# Patient Record
Sex: Male | Born: 1955 | Race: White | Hispanic: No | Marital: Married | Smoking: Never smoker
Health system: Southern US, Community
[De-identification: ages and names within clinical notes are randomized; demographics above are authoritative.]

## PROBLEM LIST (undated history)

## (undated) DIAGNOSIS — I10 Essential (primary) hypertension: Secondary | ICD-10-CM

## (undated) DIAGNOSIS — N289 Disorder of kidney and ureter, unspecified: Secondary | ICD-10-CM

## (undated) DIAGNOSIS — G250 Essential tremor: Secondary | ICD-10-CM

## (undated) HISTORY — PX: INGUINAL HERNIA REPAIR: SUR1180

## (undated) HISTORY — DX: Essential tremor: G25.0

## (undated) HISTORY — DX: Essential (primary) hypertension: I10

## (undated) HISTORY — PX: NEPHRECTOMY TRANSPLANTED ORGAN: SUR880

---

## 2012-08-06 DIAGNOSIS — N042 Nephrotic syndrome with diffuse membranous glomerulonephritis: Secondary | ICD-10-CM | POA: Insufficient documentation

## 2013-02-02 DIAGNOSIS — Z940 Kidney transplant status: Secondary | ICD-10-CM | POA: Insufficient documentation

## 2013-02-02 DIAGNOSIS — I10 Essential (primary) hypertension: Secondary | ICD-10-CM | POA: Insufficient documentation

## 2013-02-02 DIAGNOSIS — D849 Immunodeficiency, unspecified: Secondary | ICD-10-CM | POA: Insufficient documentation

## 2013-04-21 ENCOUNTER — Emergency Department (INDEPENDENT_AMBULATORY_CARE_PROVIDER_SITE_OTHER): Payer: BC Managed Care – PPO

## 2013-04-21 ENCOUNTER — Emergency Department
Admission: EM | Admit: 2013-04-21 | Discharge: 2013-04-21 | Disposition: A | Discharge status: Home / Self Care | Payer: BC Managed Care – PPO | Source: Home / Self Care | Attending: Emergency Medicine | Admitting: Emergency Medicine

## 2013-04-21 ENCOUNTER — Encounter: Payer: Self-pay | Admitting: Emergency Medicine

## 2013-04-21 DIAGNOSIS — R05 Cough: Secondary | ICD-10-CM

## 2013-04-21 DIAGNOSIS — R059 Cough, unspecified: Secondary | ICD-10-CM

## 2013-04-21 DIAGNOSIS — R062 Wheezing: Secondary | ICD-10-CM

## 2013-04-21 DIAGNOSIS — R0989 Other specified symptoms and signs involving the circulatory and respiratory systems: Secondary | ICD-10-CM

## 2013-04-21 DIAGNOSIS — J209 Acute bronchitis, unspecified: Secondary | ICD-10-CM

## 2013-04-21 HISTORY — DX: Disorder of kidney and ureter, unspecified: N28.9

## 2013-04-21 MED ORDER — IPRATROPIUM-ALBUTEROL 0.5-2.5 (3) MG/3ML IN SOLN
3.0000 mL | Freq: Once | RESPIRATORY_TRACT | Status: AC
  Administered 2013-04-21: 3 mL via RESPIRATORY_TRACT

## 2013-04-21 MED ORDER — PREDNISONE (PAK) 10 MG PO TABS: ORAL_TABLET | ORAL | Status: DC

## 2013-04-21 MED ORDER — ALBUTEROL SULFATE HFA 108 (90 BASE) MCG/ACT IN AERS: 2.0000 | INHALATION_SPRAY | RESPIRATORY_TRACT | Status: DC | PRN

## 2013-04-21 MED ORDER — AZITHROMYCIN 250 MG PO TABS: ORAL_TABLET | ORAL | Status: DC

## 2013-04-21 MED ORDER — CEFTRIAXONE SODIUM 1 G IJ SOLR
1.0000 g | INTRAMUSCULAR | Status: AC
  Administered 2013-04-21: 1 g via INTRAMUSCULAR

## 2013-04-21 NOTE — ED Notes (Signed)
Sinus pain, pressure, headache, productive cough, green sputum, congestion, runny nose x 1 week

## 2013-04-21 NOTE — ED Provider Notes (Signed)
CSN: [id number]     Arrival date & time 04/21/13  1038 History   First MD Initiated Contact with Patient 04/21/13 1040     Chief Complaint  Patient presents with  . Sinus Problem    HPI URI HISTORY  Jamez is a 58 y.o. male who complains of onset of cold symptoms for 7 days.  Has not been using any OTC treatment. Progressively worsening cough objective of green sputum, sinus congestion with discolored rhinorrhea. Low-grade fever.  He'll be traveling on business trip to [place] soon. No recent international travel.  Of note, he is a kidney transplant recipient 2002, has been doing great ever since, but is on chronic immunosuppression medication status post kidney transplant. He is doing well on this. He denies history of recurrent infections. No history of asthma or chronic lung disease.  No chills/sweats +  Low-grade Fever  +  Nasal congestion +  Discolored Post-nasal drainage Mild sinus pain/pressure No sore throat  +  Cough productive of green sputum + wheezing + chest congestion No hemoptysis No shortness of breath. He admits to some fatigue with exertion. No pleuritic pain No chest pain at rest or with exertion  No itchy/red eyes No earache  No nausea No vomiting No abdominal pain No diarrhea  No skin rashes +  Fatigue No myalgias No headache   Past Medical History  Diagnosis Date  . Renal disorder    Past Surgical History  Procedure Laterality Date  . Nephrectomy transplanted organ     No family history on file. History  Substance Use Topics  . Smoking status: Never Smoker   . Smokeless tobacco: Not on file  . Alcohol Use: Yes    Review of Systems  All other systems reviewed and are negative.   Allergies  Review of patient's allergies indicates no known allergies.  Home Medications   Current Outpatient Rx  Name  Route  Sig  Dispense  Refill  . losartan (COZAAR) 100 MG tablet   Oral   Take 100 mg by mouth daily.         . mycophenolate  (CELLCEPT) 500 MG tablet   Oral   Take by mouth 2 (two) times daily.         . tacrolimus (PROGRAF) 1 MG capsule   Oral   Take 1 mg by mouth 2 (two) times daily.         Marland Kitchen albuterol (PROVENTIL HFA;VENTOLIN HFA) 108 (90 BASE) MCG/ACT inhaler   Inhalation   Inhale 2 puffs into the lungs every 4 (four) hours as needed for wheezing or shortness of breath.   18 g   0   . azithromycin (ZITHROMAX Z-PAK) 250 MG tablet      Take 2 tablets on day one, then 1 tablet daily on days 2 through 5   1 each   0   . predniSONE (STERAPRED UNI-PAK) 10 MG tablet      Take with food for 6 days.-Take 6 on day 1, 5 on day 2, 4 on day 3, then 3 tablets on day 4, then 2 tablets on day 5, then 1 on day 6.   21 tablet   0    BP 149/94  Pulse 79  Temp(Src) 97.6 F (36.4 C) (Oral)  Ht 5\' 11"  (1.803 m)  Wt 203 lb (92.08 kg)  BMI 28.33 kg/m2  SpO2 98% Physical Exam  Nursing note and vitals reviewed. Constitutional: He is oriented to person, place, and time. He appears  well-developed and well-nourished. No distress.  Alert, pleasant male. Fatigued, but no acute distress. Ambulatory  HENT:  Head: Normocephalic and atraumatic.  Right Ear: Tympanic membrane, external ear and ear canal normal.  Left Ear: Tympanic membrane, external ear and ear canal normal.  Nose: Mucosal edema and rhinorrhea present. Right sinus exhibits maxillary sinus tenderness. Left sinus exhibits maxillary sinus tenderness.  Mouth/Throat: Oropharynx is clear and moist. No oral lesions. No oropharyngeal exudate.  Eyes: Right eye exhibits no discharge. Left eye exhibits no discharge. No scleral icterus.  Neck: Neck supple.  Cardiovascular: Normal rate, regular rhythm, S1 normal, S2 normal and normal heart sounds.  Exam reveals no gallop and no decreased pulses.   No murmur heard. Pulmonary/Chest: Effort normal. He has wheezes (diffusely). He has rhonchi (Diffuse harsh rhonchi). He has rales (mild bibasilar crackles).  Abdominal:  He exhibits no distension.  Lymphadenopathy:    He has no cervical adenopathy.  Neurological: He is alert and oriented to person, place, and time.  Skin: Skin is warm and dry. No rash noted.  Psychiatric: He has a normal mood and affect.    ED Course  Procedures (including critical care time) Labs Review Labs Reviewed - No data to display Imaging Review Dg Chest 2 View  04/21/2013   CLINICAL DATA:  Cough and congestion.  Wheezing.  EXAM: CHEST  2 VIEW  COMPARISON:  None.  FINDINGS: There is some peribronchial thickening. No consolidative process, pneumothorax or effusion. Heart size is normal.  IMPRESSION: Bronchitic change without focal process.   Electronically Signed   By: Inge Rise M.D.   On: 04/21/2013 11:34     MDM   1. Acute bronchitis with bronchospasm    Treatment options discussed, as well as risks, benefits, alternatives. Patient voiced understanding and agreement with the following plans: DuoNeb nebulizer treatment given. Wheezing improved. Auscultation of lungs afterward, and he still had moderate diffuse rhonchi and faint bibasilar crackles and wheezing significantly improved. Improved air excursion. He felt that the DuoNeb helped. Chest x-ray: Some peribronchial thickening, consistent with bronchitis but no definite infiltrates. Because of the severity of bronchitis, will start treatment with Rocephin 1 g IM stat. Prescriptions: Zithromax Z-PAK for bacterial and atypical coverage. Albuterol HFA, instructions given, questions invited and answered. Use every 4 hours when necessary wheezing  Prednisone 10 mg-6 day dosepak Other symptomatic care and precautions discussed. Follow-up with your primary care doctor in 5-7 days if not improving, or sooner if symptoms become worse. Red flags discussed.--Go to ER stat if any severe worsening symptoms Questions invited and answered. Patient voiced understanding and agreement.     Jacqulyn Cane, MD 04/21/13 1226

## 2014-02-21 ENCOUNTER — Emergency Department (INDEPENDENT_AMBULATORY_CARE_PROVIDER_SITE_OTHER)
Admission: EM | Admit: 2014-02-21 | Discharge: 2014-02-21 | Disposition: A | Discharge status: Home / Self Care | Payer: BLUE CROSS/BLUE SHIELD | Source: Home / Self Care | Attending: Family Medicine | Admitting: Family Medicine

## 2014-02-21 ENCOUNTER — Encounter: Payer: Self-pay | Admitting: Emergency Medicine

## 2014-02-21 DIAGNOSIS — H6591 Unspecified nonsuppurative otitis media, right ear: Secondary | ICD-10-CM

## 2014-02-21 MED ORDER — AMOXICILLIN 875 MG PO TABS: 875.0000 mg | ORAL_TABLET | Freq: Two times a day (BID) | ORAL | Status: DC

## 2014-02-21 MED ORDER — FLUTICASONE PROPIONATE 50 MCG/ACT NA SUSP: NASAL | Status: DC

## 2014-02-21 NOTE — ED Notes (Signed)
Sinus pain, congestion, runny nose,, rt ear pain, pressure behind eyes, headache, sore throat in the morning.

## 2014-02-21 NOTE — ED Provider Notes (Signed)
CSN: [id number]     Arrival date & time 02/21/14  1236 History   First MD Initiated Contact with Patient 02/21/14 1333     Chief Complaint  Patient presents with  . Sinus Problem      HPI Comments: Patient complains of twelve day history of typical cold-like symptoms including mild sore throat, sinus congestion, headache, fatigue, and cough.  She has now developed right ear pain, yellow sinus discharge, and facial discomfort.  No fevers, chills, and sweats   The history is provided by the patient.    Past Medical History  Diagnosis Date  . Renal disorder    Past Surgical History  Procedure Laterality Date  . Nephrectomy transplanted organ     History reviewed. No pertinent family history. History  Substance Use Topics  . Smoking status: Never Smoker   . Smokeless tobacco: Not on file  . Alcohol Use: Yes    Review of Systems + sore throat + cough No pleuritic pain No wheezing + nasal congestion + post-nasal drainage + sinus pain/pressure No itchy/red eyes + earache No hemoptysis No SOB No fever/chills No nausea No vomiting No abdominal pain No diarrhea No urinary symptoms No skin rash No fatigue No myalgias No headache Used OTC meds without relief  Allergies  Review of patient's allergies indicates no known allergies.  Home Medications   Prior to Admission medications   Medication Sig Start Date End Date Taking? Authorizing Provider  albuterol (PROVENTIL HFA;VENTOLIN HFA) 108 (90 BASE) MCG/ACT inhaler Inhale 2 puffs into the lungs every 4 (four) hours as needed for wheezing or shortness of breath. 04/21/13   Jacqulyn Cane, MD  amoxicillin (AMOXIL) 875 MG tablet Take 1 tablet (875 mg total) by mouth 2 (two) times daily. 02/21/14   Kandra Nicolas, MD  fluticasone Asencion Islam) 50 MCG/ACT nasal spray Place two sprays in each nostril once daily 02/21/14   Kandra Nicolas, MD  losartan (COZAAR) 100 MG tablet Take 100 mg by mouth daily.    Historical Provider, MD   mycophenolate (CELLCEPT) 500 MG tablet Take by mouth 2 (two) times daily.    Historical Provider, MD  predniSONE (STERAPRED UNI-PAK) 10 MG tablet Take with food for 6 days.-Take 6 on day 1, 5 on day 2, 4 on day 3, then 3 tablets on day 4, then 2 tablets on day 5, then 1 on day 6. 04/21/13   Jacqulyn Cane, MD  tacrolimus (PROGRAF) 1 MG capsule Take 1 mg by mouth 2 (two) times daily.    Historical Provider, MD   BP 137/80 mmHg  Pulse 72  Temp(Src) 98.4 F (36.9 C) (Oral)  Ht 5\' 11"  (1.803 m)  Wt 205 lb (92.987 kg)  BMI 28.60 kg/m2  SpO2 98% Physical Exam Nursing notes and Vital Signs reviewed. Appearance:  Patient appears stated age, and in no acute distress Eyes:  Pupils are equal, round, and reactive to light and accomodation.  Extraocular movement is intact.  Conjunctivae are not inflamed  Ears:  Canals normal.  Left tympanic membrane is normal. Right tympanic membrane has poor landmarks with serous effusion. Nose:  Congested turbinates.   Mild maxillary sinus tenderness is present.  Pharynx:  Normal Neck:  Supple. No adenopathy Lungs:  Clear to auscultation.  Breath sounds are equal.  Heart:  Regular rate and rhythm without murmurs, rubs, or gallops.  Abdomen:  Nontender without masses or hepatosplenomegaly.  Bowel sounds are present.  No CVA or flank tenderness.  Extremities:  No edema.  No calf tenderness Skin:  No rash present.   ED Course  Procedures none   MDM   1. Right otitis media with effusion    Begin amoxicillin (may wait two or three days) Take plain guaifenesin 1200mg  (Mucinex) twice daily, with plenty of water, for cough and congestion.  Get adequate rest.   May use Afrin nasal spray (or generic oxymetazoline) twice daily for about 5 days.  Also recommend using saline nasal spray several times daily and saline nasal irrigation (AYR is a common brand).  Use Flonase after Afrin and saline rinse.  Stop all antihistamines for now, and other non-prescription cough/cold  preparations.   Follow-up with family doctor if not improving about one week.Marland Kitchen     Kandra Nicolas, MD 02/26/14 [phone]

## 2014-02-21 NOTE — Discharge Instructions (Signed)
Take plain guaifenesin 1200mg  (Mucinex) twice daily, with plenty of water, for cough and congestion.  Get adequate rest.   May use Afrin nasal spray (or generic oxymetazoline) twice daily for about 5 days.  Also recommend using saline nasal spray several times daily and saline nasal irrigation (AYR is a common brand).  Use Flonase after Afrin and saline rinse.  Stop all antihistamines for now, and other non-prescription cough/cold preparations. Begin Amoxicillin if not improving about one week or if persistent fever develops (Given a prescription to hold, with an expiration date)  Follow-up with family doctor if not improving about one week.Christopher Mathis

## 2016-03-25 DIAGNOSIS — C4492 Squamous cell carcinoma of skin, unspecified: Secondary | ICD-10-CM | POA: Insufficient documentation

## 2017-02-05 DIAGNOSIS — H6502 Acute serous otitis media, left ear: Secondary | ICD-10-CM | POA: Insufficient documentation

## 2017-02-05 DIAGNOSIS — H6983 Other specified disorders of Eustachian tube, bilateral: Secondary | ICD-10-CM | POA: Insufficient documentation

## 2017-03-19 DIAGNOSIS — H903 Sensorineural hearing loss, bilateral: Secondary | ICD-10-CM | POA: Insufficient documentation

## 2017-04-30 ENCOUNTER — Other Ambulatory Visit (HOSPITAL_COMMUNITY): Payer: Self-pay | Admitting: Endocrinology

## 2017-04-30 ENCOUNTER — Ambulatory Visit (HOSPITAL_COMMUNITY)
Admission: RE | Admit: 2017-04-30 | Discharge: 2017-04-30 | Disposition: A | Discharge status: Home / Self Care | Payer: BLUE CROSS/BLUE SHIELD | Source: Ambulatory Visit | Attending: Vascular Surgery | Admitting: Vascular Surgery

## 2017-04-30 DIAGNOSIS — I82812 Embolism and thrombosis of superficial veins of left lower extremities: Secondary | ICD-10-CM | POA: Diagnosis not present

## 2017-04-30 DIAGNOSIS — H34823 Venous engorgement, bilateral: Secondary | ICD-10-CM | POA: Insufficient documentation

## 2017-04-30 DIAGNOSIS — I8000 Phlebitis and thrombophlebitis of superficial vessels of unspecified lower extremity: Secondary | ICD-10-CM

## 2017-04-30 DIAGNOSIS — M79605 Pain in left leg: Secondary | ICD-10-CM | POA: Insufficient documentation

## 2017-05-01 ENCOUNTER — Other Ambulatory Visit: Payer: Self-pay

## 2017-05-01 DIAGNOSIS — H34823 Venous engorgement, bilateral: Secondary | ICD-10-CM

## 2017-06-24 ENCOUNTER — Ambulatory Visit (HOSPITAL_COMMUNITY)
Admission: RE | Admit: 2017-06-24 | Discharge: 2017-06-24 | Disposition: A | Discharge status: Home / Self Care | Payer: BLUE CROSS/BLUE SHIELD | Source: Ambulatory Visit | Attending: Vascular Surgery | Admitting: Vascular Surgery

## 2017-06-24 ENCOUNTER — Ambulatory Visit (INDEPENDENT_AMBULATORY_CARE_PROVIDER_SITE_OTHER): Payer: BLUE CROSS/BLUE SHIELD | Admitting: Vascular Surgery

## 2017-06-24 ENCOUNTER — Encounter: Payer: Self-pay | Admitting: Vascular Surgery

## 2017-06-24 VITALS — BP 131/75 | HR 80 | Temp 98.4°F | Resp 16 | Ht 70.0 in | Wt 198.0 lb

## 2017-06-24 DIAGNOSIS — H34823 Venous engorgement, bilateral: Secondary | ICD-10-CM | POA: Diagnosis not present

## 2017-06-24 DIAGNOSIS — R609 Edema, unspecified: Secondary | ICD-10-CM | POA: Insufficient documentation

## 2017-06-24 DIAGNOSIS — I83893 Varicose veins of bilateral lower extremities with other complications: Secondary | ICD-10-CM

## 2017-06-24 DIAGNOSIS — R936 Abnormal findings on diagnostic imaging of limbs: Secondary | ICD-10-CM | POA: Insufficient documentation

## 2017-06-24 NOTE — Progress Notes (Signed)
[facility]ist of [facility]  Patient name: Christopher Mathis MRN: [id number] DOB: 1955-12-05 Sex: male  REASON FOR CONSULT: Bilateral lower extremity varicose veins with pain  HPI: Christopher Mathis is a 62 y.o. male, who is here today for evaluation of bilateral lower extremity varicose veins.  He is a very pleasant 62 year old gentleman.  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  He is done extremely well since that time.  He does have progressive bilateral marked lower extremity varicose veins.  He reports a several different issues regarding these.  He does have pain specifically over the large engorged veins bilaterally.  He does have swelling associated with this and a tired achy sensation with swelling.  He has had no DVT and has had no venous ulcerations.  He does notice some improvement with elevation.  He has no history of arterial insufficiency.  Does have a history of DVT in his father  Past Medical History:  Diagnosis Date  . Renal disorder     History reviewed. No pertinent family history.  SOCIAL HISTORY: Social History   Socioeconomic History  . Marital status: Married    Spouse name: Not on file  . Number of children: Not on file  . Years of education: Not on file  . Highest education level: Not on file  Occupational History  . Not on file  Social Needs  . Financial resource strain: Not on file  . Food insecurity:    Worry: Not on file    Inability: Not on file  . Transportation needs:    Medical: Not on file    Non-medical: Not on file  Tobacco Use  . Smoking status: Never Smoker  . Smokeless tobacco: Never Used  Substance and Sexual Activity  . Alcohol use: Yes  . Drug use: Not on file  . Sexual activity: Not on file  Lifestyle  . Physical activity:    Days per week: Not on file    Minutes per session: Not on file  . Stress: Not on file  Relationships  . Social  connections:    Talks on phone: Not on file    Gets together: Not on file    Attends religious service: Not on file    Active member of club or organization: Not on file    Attends meetings of clubs or organizations: Not on file    Relationship status: Not on file  . Intimate partner violence:    Fear of current or ex partner: Not on file    Emotionally abused: Not on file    Physically abused: Not on file    Forced sexual activity: Not on file  Other Topics Concern  . Not on file  Social History Narrative  . Not on file    No Known Allergies  Current Outpatient Medications  Medication Sig Dispense Refill  . fluticasone (FLONASE) 50 MCG/ACT nasal spray Place two sprays in each nostril once daily 16 g 1  . losartan (COZAAR) 100 MG tablet Take 100 mg by mouth daily.    . mycophenolate (CELLCEPT) 500 MG tablet Take 1,000 mg by mouth 2 (two) times daily.     . predniSONE (DELTASONE) 5 MG tablet Take 5 mg by mouth daily with breakfast.    . tacrolimus (PROGRAF) 1 MG capsule Take 1.5 mg by mouth 2 (two) times daily.     Marland Kitchen albuterol (PROVENTIL HFA;VENTOLIN HFA) 108 (90 BASE) MCG/ACT  inhaler Inhale 2 puffs into the lungs every 4 (four) hours as needed for wheezing or shortness of breath. (Patient not taking: Reported on 06/24/2017) 18 g 0  . amoxicillin (AMOXIL) 875 MG tablet Take 1 tablet (875 mg total) by mouth 2 (two) times daily. (Patient not taking: Reported on 06/24/2017) 20 tablet 0  . predniSONE (STERAPRED UNI-PAK) 10 MG tablet Take with food for 6 days.-Take 6 on day 1, 5 on day 2, 4 on day 3, then 3 tablets on day 4, then 2 tablets on day 5, then 1 on day 6. (Patient not taking: Reported on 06/24/2017) 21 tablet 0   No current facility-administered medications for this visit.     REVIEW OF SYSTEMS:  [X]  denotes positive finding, [ ]  denotes negative finding Cardiac  Comments:  Chest pain or chest pressure:    Shortness of breath upon exertion:    Short of breath when lying flat:     Irregular heart rhythm:        Vascular    Pain in calf, thigh, or hip brought on by ambulation:    Pain in feet at night that wakes you up from your sleep:     Blood clot in your veins:    Leg swelling:  x       Pulmonary    Oxygen at home:    Productive cough:     Wheezing:         Neurologic    Sudden weakness in arms or legs:     Sudden numbness in arms or legs:     Sudden onset of difficulty speaking or slurred speech:    Temporary loss of vision in one eye:     Problems with dizziness:         Gastrointestinal    Blood in stool:     Vomited blood:         Genitourinary    Burning when urinating:     Blood in urine:        Psychiatric    Major depression:         Hematologic    Bleeding problems:    Problems with blood clotting too easily:        Skin    Rashes or ulcers:        Constitutional    Fever or chills:      PHYSICAL EXAM: Vitals:   06/24/17 1423  BP: 131/75  Pulse: 80  Resp: 16  Temp: 98.4 F (36.9 C)  SpO2: 99%  Weight: 198 lb (89.8 kg)  Height: 5\' 10"  (1.778 m)    GENERAL: The patient is a well-nourished male, in no acute distress. The vital signs are documented above. CARDIOVASCULAR: 2+ radial and 2+ dorsalis pedis pulses bilaterally.  Marked superficial varicosities in both thighs and calves.  Also extensive Telangiectasia extending into his ankles bilaterally PULMONARY: There is good air exchange  ABDOMEN: Soft and non-tender  MUSCULOSKELETAL: There are no major deformities or cyanosis. NEUROLOGIC: No focal weakness or paresthesias are detected. SKIN: There are no ulcers or rashes noted. PSYCHIATRIC: The patient has a normal affect.  DATA:  Noninvasive venous studies today revealed reflux in his great saphenous vein bilaterally with enlargement of 6 to 7-1/2 mm throughout his thigh.  I imaged his veins with SonoSite ultrasound and he does have dilatation of the saphenous vein with reflux extending into these  varicosities  MEDICAL ISSUES: I had a long discussion with the patient explaining that  this does not put him at any increased risk for DVT.  I have explained the conservative treatment which she has elevation when possible and graduated compression garments.  We have fitted him with graduation compression garments 20 to 30 mmHg thigh-high today instructed him on the use of these.  We will see him again in 3 months for further evaluation.  I explained that he would be an excellent candidate for bilateral staged laser ablation and stab phlebectomy for treatment should he fail conservative treatment.  Also explained that we are transitioning in our office and that Dr. Scot Dock will be assuming care of venous patients.   Rosetta Posner, MD FACS [facility] Tel [phone] Pager [phone]

## 2017-09-24 ENCOUNTER — Encounter: Payer: Self-pay | Admitting: Vascular Surgery

## 2017-09-24 ENCOUNTER — Other Ambulatory Visit: Payer: Self-pay

## 2017-09-24 ENCOUNTER — Ambulatory Visit (INDEPENDENT_AMBULATORY_CARE_PROVIDER_SITE_OTHER): Payer: BLUE CROSS/BLUE SHIELD | Admitting: Vascular Surgery

## 2017-09-24 VITALS — BP 145/81 | HR 71 | Resp 20 | Ht 70.0 in | Wt 185.7 lb

## 2017-09-24 DIAGNOSIS — I83813 Varicose veins of bilateral lower extremities with pain: Secondary | ICD-10-CM

## 2017-09-24 NOTE — Progress Notes (Signed)
Patient name: Christopher Mathis MRN: [id number] DOB: May 27, 1955 Sex: male  REASON FOR VISIT:   Follow-up of varicose veins  HPI:   Christopher Mathis is a pleasant 62 y.o. male who was last seen in the office on 06/24/2017 by Dr. Curt Jews.  He had bilateral lower extremity varicose veins that were painful.  Patient had a living nonrelated kidney transplantation in 2001.  He had significant pain associated with his varicose veins.  Duplex study at that visit showed significant reflux in his great saphenous veins bilaterally and these were enlarged.  Conservative treatment was recommended and he was encouraged to elevate his leg and wear his thigh-high compression stockings with a gradient of 20 to 30 mmHg.  He comes in for 59-month follow-up visit.  On my history, the patient denies significant pain in his legs currently.  He does wear his thigh-high compression stockings on which do help.  He works sitting but really does not have a lot of symptoms at work now.  He denies any history of DVT or phlebitis.  He does describe some intermittent leg swelling which she attributes to his renal disease.  Current Outpatient Medications  Medication Sig Dispense Refill  . fluticasone (FLONASE) 50 MCG/ACT nasal spray Place two sprays in each nostril once daily 16 g 1  . losartan (COZAAR) 100 MG tablet Take 100 mg by mouth daily.    . mycophenolate (CELLCEPT) 500 MG tablet Take 1,000 mg by mouth 2 (two) times daily.     . predniSONE (STERAPRED UNI-PAK) 10 MG tablet Take with food for 6 days.-Take 6 on day 1, 5 on day 2, 4 on day 3, then 3 tablets on day 4, then 2 tablets on day 5, then 1 on day 6. 21 tablet 0  . tacrolimus (PROGRAF) 1 MG capsule Take 1.5 mg by mouth 2 (two) times daily.     Marland Kitchen albuterol (PROVENTIL HFA;VENTOLIN HFA) 108 (90 BASE) MCG/ACT inhaler Inhale 2 puffs into the lungs every 4 (four) hours as needed for wheezing or shortness of breath. (Patient not taking: Reported on 06/24/2017) 18 g 0  .  amoxicillin (AMOXIL) 875 MG tablet Take 1 tablet (875 mg total) by mouth 2 (two) times daily. (Patient not taking: Reported on 06/24/2017) 20 tablet 0  . predniSONE (DELTASONE) 5 MG tablet Take 5 mg by mouth daily with breakfast.     No current facility-administered medications for this visit.     REVIEW OF SYSTEMS:  [X]  denotes positive finding, [ ]  denotes negative finding Vascular    Leg swelling    Cardiac    Chest pain or chest pressure:    Shortness of breath upon exertion:    Short of breath when lying flat:    Irregular heart rhythm:    Constitutional    Fever or chills:     PHYSICAL EXAM:   Vitals:   09/24/17 0835 09/24/17 0836  BP: (!) 147/88 (!) 145/81  Pulse: 71   Resp: 20   SpO2: 98%   Weight: 185 lb 11.2 oz (84.2 kg)   Height: 5\' 10"  (1.778 m)     GENERAL: The patient is a well-nourished male, in no acute distress. The vital signs are documented above. CARDIOVASCULAR: There is a regular rate and rhythm. PULMONARY: There is good air exchange bilaterally without wheezing or rales. VASCULAR: Arterial: He has palpable pedal pulses bilaterally. VENOUS: He does have some large varicose veins along the medial aspect of his right calf and right thigh and  also a large cluster in his medial left calf.  He also has some telangiectasias bilaterally.  I did look at the great saphenous veins with the SonoSite and on both sides the area of reflux is fairly short and also the vein becomes superficial. There is no significant leg swelling currently.  There is no significant hyperpigmentation.  DATA:   I reviewed his previous reflux study which does show reflux in both great saphenous veins in the proximal thigh.  On the left side there is additional reflux in the mid calf.  On the right side there is additional reason reflux in the mid thigh and proximal calf.  MEDICAL ISSUES:   CHRONIC VENOUS INSUFFICIENCY: Currently the patient's symptoms are tolerable.  We have discussed  the importance of intermittent leg elevation the proper positioning for this.  I written him a prescription for knee-high compression stockings with a gradient of 15 to 20 mmHg which may be more practical at work.  I have encouraged him to avoid prolonged sitting and standing.  We discussed the importance of exercise and weight management also.  He does understand that venous disease is a chronic disease and certainly if his symptoms progress in the future he could be considered for endovenous laser ablation of both great saphenous veins in the thigh.  Likewise this disease may progress.  He will call if his symptoms progress.  Deitra Mayo [facility] [phone]

## 2018-01-28 ENCOUNTER — Other Ambulatory Visit: Payer: Self-pay | Admitting: Otolaryngology

## 2018-01-28 DIAGNOSIS — H918X9 Other specified hearing loss, unspecified ear: Secondary | ICD-10-CM

## 2018-02-05 ENCOUNTER — Ambulatory Visit
Admission: RE | Admit: 2018-02-05 | Discharge: 2018-02-05 | Disposition: A | Discharge status: Home / Self Care | Payer: BLUE CROSS/BLUE SHIELD | Source: Ambulatory Visit | Attending: Otolaryngology | Admitting: Otolaryngology

## 2018-02-05 DIAGNOSIS — H918X9 Other specified hearing loss, unspecified ear: Secondary | ICD-10-CM

## 2018-02-05 MED ORDER — GADOBENATE DIMEGLUMINE 529 MG/ML IV SOLN
15.0000 mL | Freq: Once | INTRAVENOUS | Status: AC | PRN
  Administered 2018-02-05: 15 mL via INTRAVENOUS

## 2019-05-14 DIAGNOSIS — Z940 Kidney transplant status: Secondary | ICD-10-CM | POA: Diagnosis not present

## 2019-05-14 DIAGNOSIS — Z79899 Other long term (current) drug therapy: Secondary | ICD-10-CM | POA: Diagnosis not present

## 2019-05-14 DIAGNOSIS — Z6825 Body mass index (BMI) 25.0-25.9, adult: Secondary | ICD-10-CM | POA: Diagnosis not present

## 2019-05-14 DIAGNOSIS — Z85828 Personal history of other malignant neoplasm of skin: Secondary | ICD-10-CM | POA: Diagnosis not present

## 2019-05-14 DIAGNOSIS — Z5181 Encounter for therapeutic drug level monitoring: Secondary | ICD-10-CM | POA: Diagnosis not present

## 2019-05-14 DIAGNOSIS — I10 Essential (primary) hypertension: Secondary | ICD-10-CM | POA: Diagnosis not present

## 2019-05-14 DIAGNOSIS — D649 Anemia, unspecified: Secondary | ICD-10-CM | POA: Diagnosis not present

## 2019-05-14 DIAGNOSIS — Z4822 Encounter for aftercare following kidney transplant: Secondary | ICD-10-CM | POA: Diagnosis not present

## 2019-05-20 DIAGNOSIS — L570 Actinic keratosis: Secondary | ICD-10-CM | POA: Diagnosis not present

## 2019-05-20 DIAGNOSIS — L578 Other skin changes due to chronic exposure to nonionizing radiation: Secondary | ICD-10-CM | POA: Diagnosis not present

## 2019-05-20 DIAGNOSIS — C44212 Basal cell carcinoma of skin of right ear and external auricular canal: Secondary | ICD-10-CM | POA: Diagnosis not present

## 2019-05-20 DIAGNOSIS — D492 Neoplasm of unspecified behavior of bone, soft tissue, and skin: Secondary | ICD-10-CM | POA: Diagnosis not present

## 2019-05-20 DIAGNOSIS — Z85828 Personal history of other malignant neoplasm of skin: Secondary | ICD-10-CM | POA: Diagnosis not present

## 2019-05-31 DIAGNOSIS — E7849 Other hyperlipidemia: Secondary | ICD-10-CM | POA: Diagnosis not present

## 2019-05-31 DIAGNOSIS — Z1389 Encounter for screening for other disorder: Secondary | ICD-10-CM | POA: Diagnosis not present

## 2019-05-31 DIAGNOSIS — Z0000 Encounter for general adult medical examination without abnormal findings: Secondary | ICD-10-CM | POA: Diagnosis not present

## 2019-05-31 DIAGNOSIS — E871 Hypo-osmolality and hyponatremia: Secondary | ICD-10-CM | POA: Diagnosis not present

## 2019-05-31 DIAGNOSIS — Z940 Kidney transplant status: Secondary | ICD-10-CM | POA: Diagnosis not present

## 2019-05-31 DIAGNOSIS — I10 Essential (primary) hypertension: Secondary | ICD-10-CM | POA: Diagnosis not present

## 2019-05-31 DIAGNOSIS — Z125 Encounter for screening for malignant neoplasm of prostate: Secondary | ICD-10-CM | POA: Diagnosis not present

## 2019-06-01 DIAGNOSIS — H35363 Drusen (degenerative) of macula, bilateral: Secondary | ICD-10-CM | POA: Diagnosis not present

## 2019-07-29 DIAGNOSIS — C44212 Basal cell carcinoma of skin of right ear and external auricular canal: Secondary | ICD-10-CM | POA: Diagnosis not present

## 2019-08-13 DIAGNOSIS — Z5181 Encounter for therapeutic drug level monitoring: Secondary | ICD-10-CM | POA: Diagnosis not present

## 2019-08-13 DIAGNOSIS — D849 Immunodeficiency, unspecified: Secondary | ICD-10-CM | POA: Diagnosis not present

## 2019-08-13 DIAGNOSIS — Z940 Kidney transplant status: Secondary | ICD-10-CM | POA: Diagnosis not present

## 2019-10-23 DIAGNOSIS — Z23 Encounter for immunization: Secondary | ICD-10-CM | POA: Diagnosis not present

## 2019-11-03 DIAGNOSIS — Z85828 Personal history of other malignant neoplasm of skin: Secondary | ICD-10-CM | POA: Insufficient documentation

## 2019-11-04 DIAGNOSIS — L821 Other seborrheic keratosis: Secondary | ICD-10-CM | POA: Diagnosis not present

## 2019-11-04 DIAGNOSIS — D492 Neoplasm of unspecified behavior of bone, soft tissue, and skin: Secondary | ICD-10-CM | POA: Diagnosis not present

## 2019-11-04 DIAGNOSIS — L219 Seborrheic dermatitis, unspecified: Secondary | ICD-10-CM | POA: Diagnosis not present

## 2019-11-04 DIAGNOSIS — L910 Hypertrophic scar: Secondary | ICD-10-CM | POA: Diagnosis not present

## 2019-11-04 DIAGNOSIS — Z9225 Personal history of immunosupression therapy: Secondary | ICD-10-CM | POA: Diagnosis not present

## 2019-12-06 DIAGNOSIS — Z940 Kidney transplant status: Secondary | ICD-10-CM | POA: Diagnosis not present

## 2019-12-06 DIAGNOSIS — N281 Cyst of kidney, acquired: Secondary | ICD-10-CM | POA: Diagnosis not present

## 2019-12-15 DIAGNOSIS — Z6823 Body mass index (BMI) 23.0-23.9, adult: Secondary | ICD-10-CM | POA: Diagnosis not present

## 2019-12-15 DIAGNOSIS — Z5181 Encounter for therapeutic drug level monitoring: Secondary | ICD-10-CM | POA: Diagnosis not present

## 2019-12-15 DIAGNOSIS — Z940 Kidney transplant status: Secondary | ICD-10-CM | POA: Diagnosis not present

## 2019-12-15 DIAGNOSIS — D849 Immunodeficiency, unspecified: Secondary | ICD-10-CM | POA: Diagnosis not present

## 2020-05-18 DIAGNOSIS — D492 Neoplasm of unspecified behavior of bone, soft tissue, and skin: Secondary | ICD-10-CM | POA: Diagnosis not present

## 2020-05-18 DIAGNOSIS — L578 Other skin changes due to chronic exposure to nonionizing radiation: Secondary | ICD-10-CM | POA: Diagnosis not present

## 2020-05-18 DIAGNOSIS — L821 Other seborrheic keratosis: Secondary | ICD-10-CM | POA: Diagnosis not present

## 2020-05-18 DIAGNOSIS — L570 Actinic keratosis: Secondary | ICD-10-CM | POA: Diagnosis not present

## 2020-05-18 DIAGNOSIS — L219 Seborrheic dermatitis, unspecified: Secondary | ICD-10-CM | POA: Diagnosis not present

## 2020-06-08 DIAGNOSIS — E785 Hyperlipidemia, unspecified: Secondary | ICD-10-CM | POA: Diagnosis not present

## 2020-06-08 DIAGNOSIS — Z1331 Encounter for screening for depression: Secondary | ICD-10-CM | POA: Diagnosis not present

## 2020-06-08 DIAGNOSIS — Z0000 Encounter for general adult medical examination without abnormal findings: Secondary | ICD-10-CM | POA: Diagnosis not present

## 2020-06-08 DIAGNOSIS — Z1389 Encounter for screening for other disorder: Secondary | ICD-10-CM | POA: Diagnosis not present

## 2020-06-08 DIAGNOSIS — R82998 Other abnormal findings in urine: Secondary | ICD-10-CM | POA: Diagnosis not present

## 2020-06-08 DIAGNOSIS — I10 Essential (primary) hypertension: Secondary | ICD-10-CM | POA: Diagnosis not present

## 2020-07-21 DIAGNOSIS — D649 Anemia, unspecified: Secondary | ICD-10-CM | POA: Diagnosis not present

## 2020-07-21 DIAGNOSIS — Z940 Kidney transplant status: Secondary | ICD-10-CM | POA: Diagnosis not present

## 2020-07-21 DIAGNOSIS — R808 Other proteinuria: Secondary | ICD-10-CM | POA: Diagnosis not present

## 2020-07-21 DIAGNOSIS — L570 Actinic keratosis: Secondary | ICD-10-CM | POA: Diagnosis not present

## 2020-07-21 DIAGNOSIS — Z1159 Encounter for screening for other viral diseases: Secondary | ICD-10-CM | POA: Diagnosis not present

## 2020-07-21 DIAGNOSIS — E1122 Type 2 diabetes mellitus with diabetic chronic kidney disease: Secondary | ICD-10-CM | POA: Diagnosis not present

## 2020-07-21 DIAGNOSIS — D849 Immunodeficiency, unspecified: Secondary | ICD-10-CM | POA: Diagnosis not present

## 2020-07-21 DIAGNOSIS — I120 Hypertensive chronic kidney disease with stage 5 chronic kidney disease or end stage renal disease: Secondary | ICD-10-CM | POA: Diagnosis not present

## 2020-07-21 DIAGNOSIS — N186 End stage renal disease: Secondary | ICD-10-CM | POA: Diagnosis not present

## 2020-08-29 IMAGING — MR MR HEAD WO/W CM
12 series · 37 of 48 positions shown · IV contrast (multihance)
Comparison: None.

CLINICAL DATA: Asymmetric left-sided hearing loss for 9 months.

Creatinine was obtained on site at [HOSPITAL] at [HOSPITAL].
Results: Creatinine 1.0 mg/dL.
EXAM:
MRI HEAD WITHOUT AND WITH CONTRAST
TECHNIQUE: Multiplanar, multiecho pulse sequences of the brain and surrounding
structures were obtained without and with intravenous contrast.
CONTRAST:  15mL MULTIHANCE GADOBENATE DIMEGLUMINE 529 MG/ML IV SOLN

[Series 2: T1 · sagittal · 5.0mm · 0.47mm/px · 2 of 21 slices shown (1 of 3)]
[im 1/21]
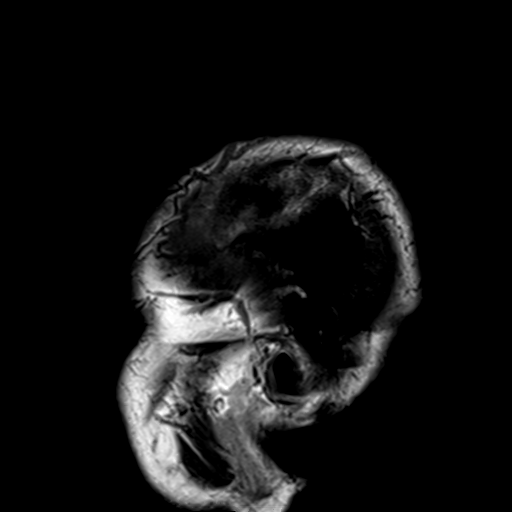
[im 21/21]
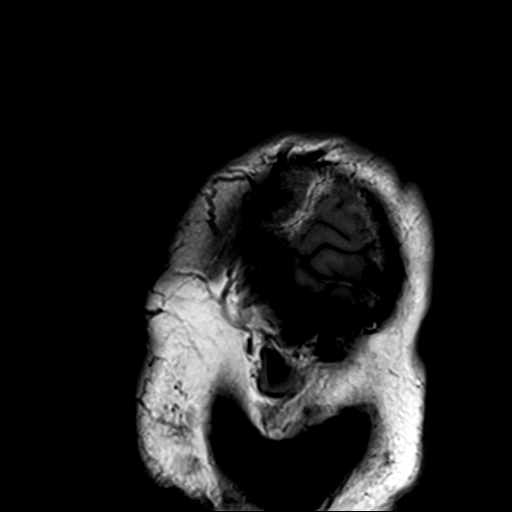

[Series 3: DWI · axial · 3.0mm · 1.80mm/px · z∈[-51,+108]mm · 9 of 108 slices shown (1 of 2)]
[im 1/108]
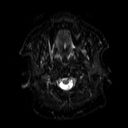
[im 20/108]
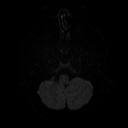
[im 30/108]
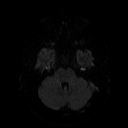
[im 49/108]
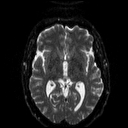
[im 59/108]
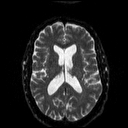
[im 78/108]
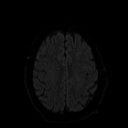
[im 88/108]
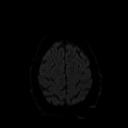
[im 98/108]
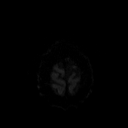
[im 108/108]
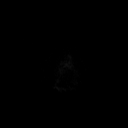

[Series 4: DWI · axial · 3.0mm · 1.80mm/px · z∈[-51,+108]mm · 5 of 51 slices shown (2 of 2)]
[im 1/51]
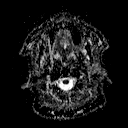
[im 13/51]
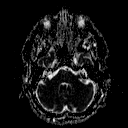
[im 26/51]
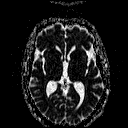
[im 38/51]
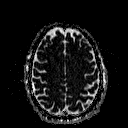
[im 51/51]
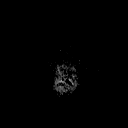

[Series 5: T2 · axial · 5.0mm · 0.45mm/px · z∈[-55,+107]mm · 3 of 26 slices shown]
[im 1/26]
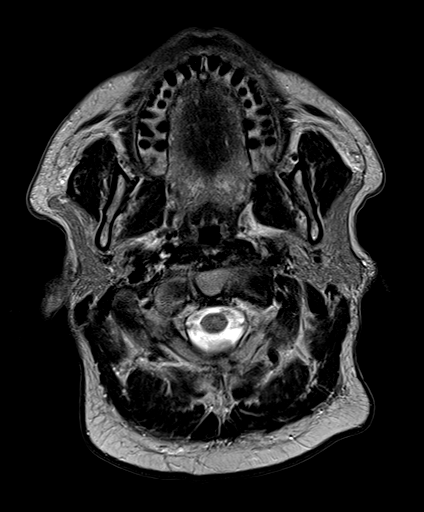
[im 13/26]
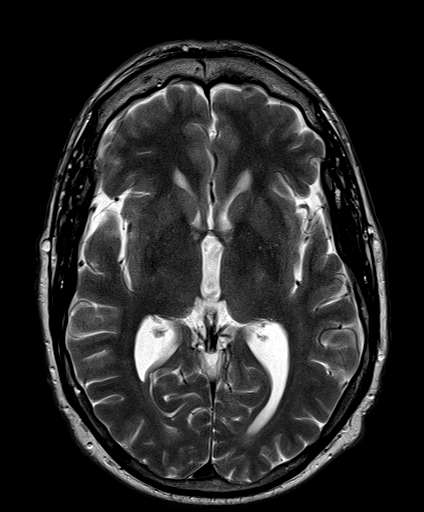
[im 26/26]
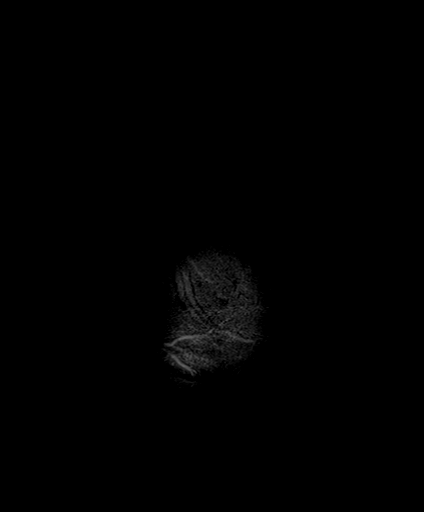

[Series 6: FLAIR · axial · 3.0mm · 0.45mm/px · z∈[-50,+106]mm · 3 of 27 slices shown]
[im 1/27]
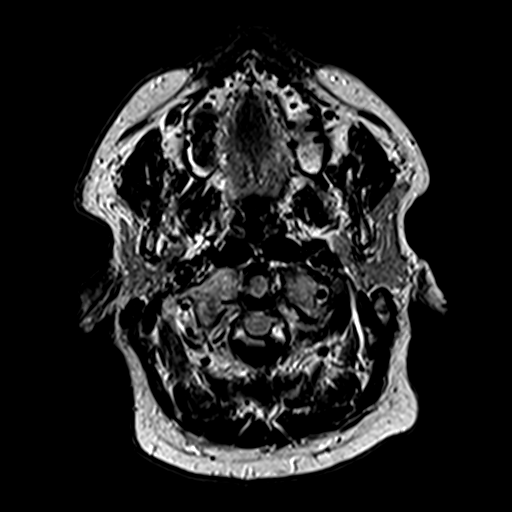
[im 14/27]
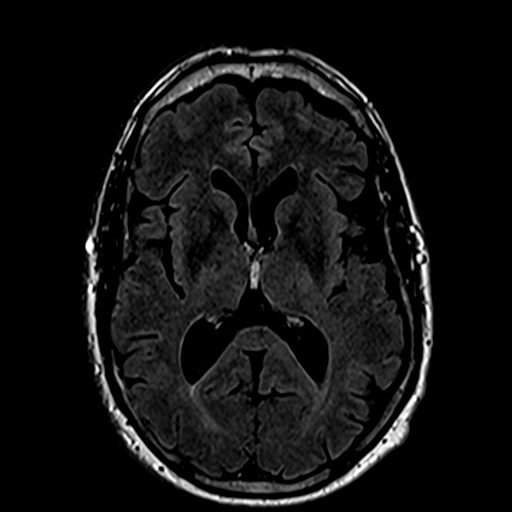
[im 27/27]
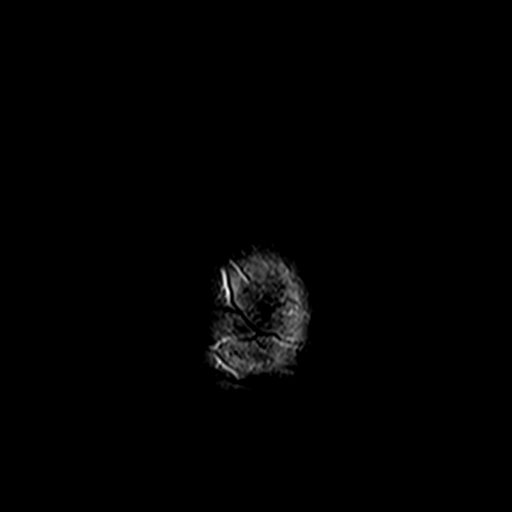

[Series 7: T1 · coronal · 3.0mm · 0.35mm/px · 1 of 11 slices shown (2 of 3)]
[im 1/11]
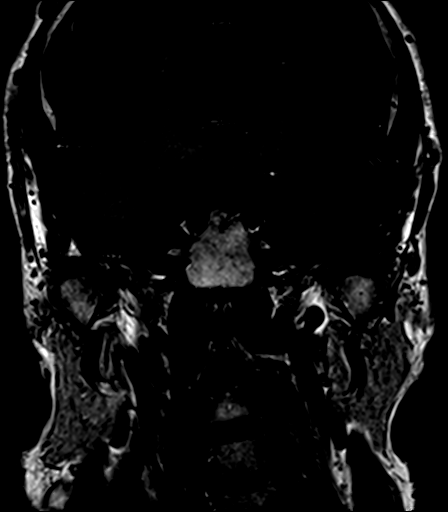

[Series 9: swi_images · axial · 3.0mm · 0.90mm/px · z∈[-55,+110]mm · 6 of 56 slices shown]
[im 1/56]
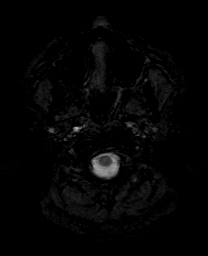
[im 12/56]
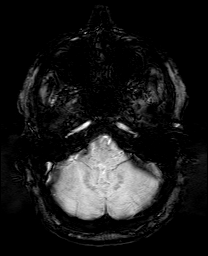
[im 23/56]
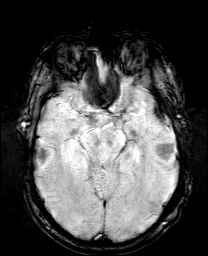
[im 34/56]
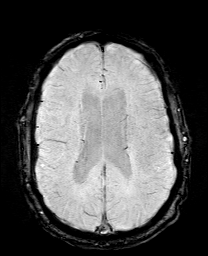
[im 45/56]
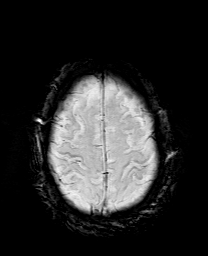
[im 56/56]
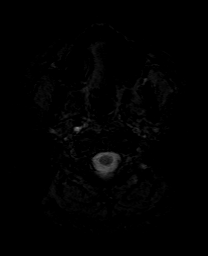

[Series 10: T1 · axial · 3.0mm · 0.35mm/px · 1 of 11 slices shown (3 of 3)]
[im 1/11]
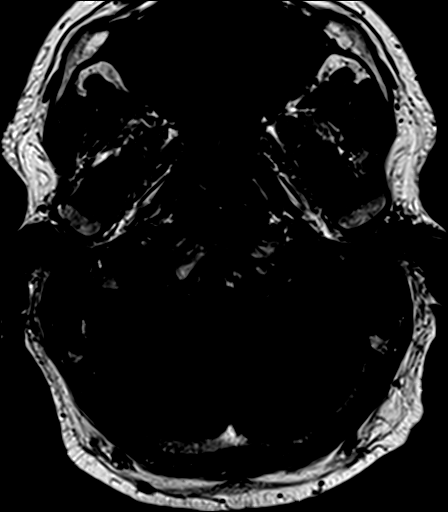

[Series 11: bSSFP · axial · 1.0mm · 0.28mm/px · z∈[-29,+6]mm · 4 of 36 slices shown]
[im 1/36]
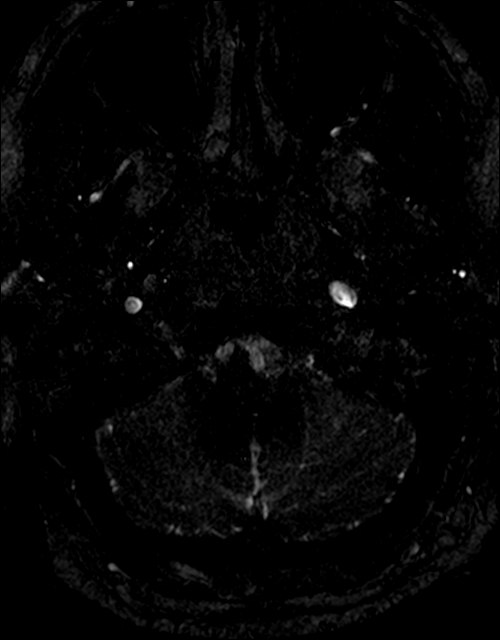
[im 12/36]
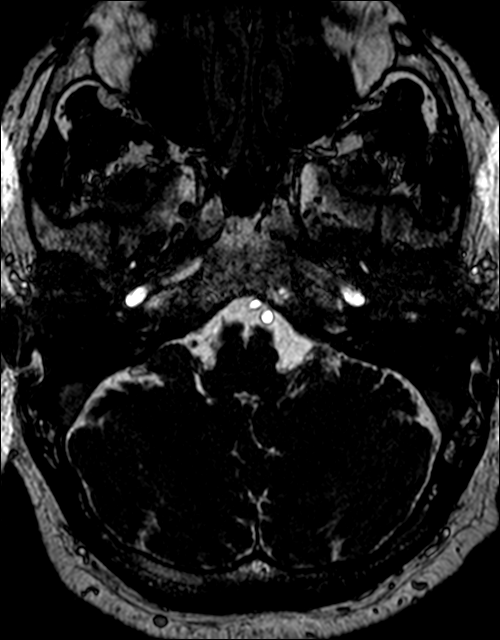
[im 24/36]
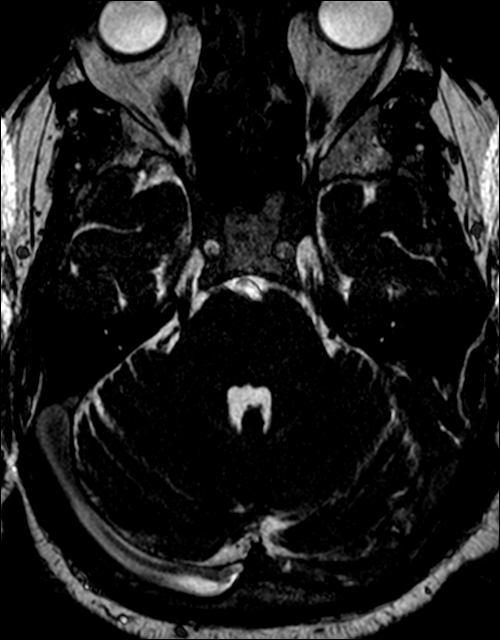
[im 36/36]
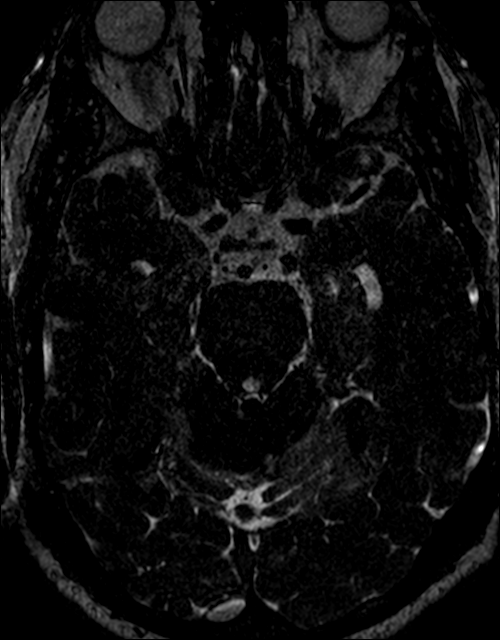

[Series 12: T1 post-contrast · coronal · 3.0mm · 0.35mm/px · 1 of 11 slices shown (1 of 2)]
[im 1/11]
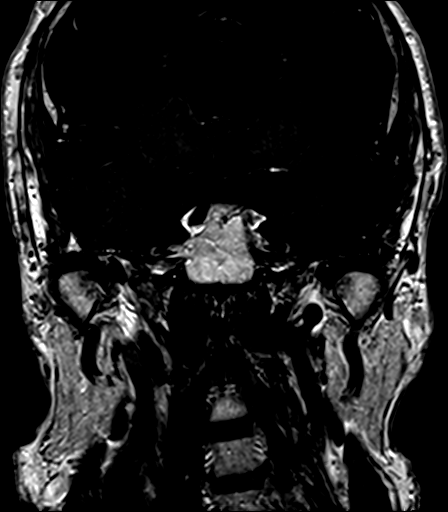

[Series 13: T1 post-contrast · axial · 3.0mm · 0.35mm/px · 1 of 11 slices shown (2 of 2)]
[im 1/11]
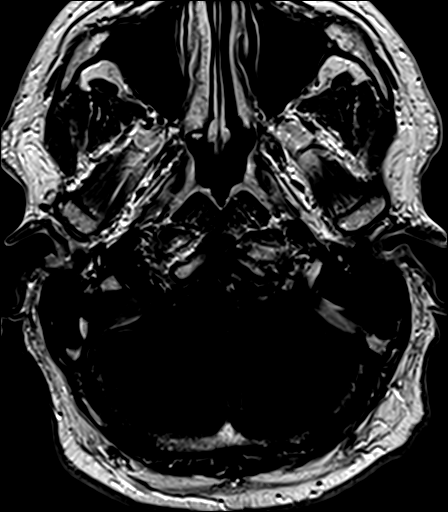

[Series 14: post_t1_mpr_tra · axial · 2.0mm · 0.45mm/px · 1 of 80 slices shown]
[im 1/80]
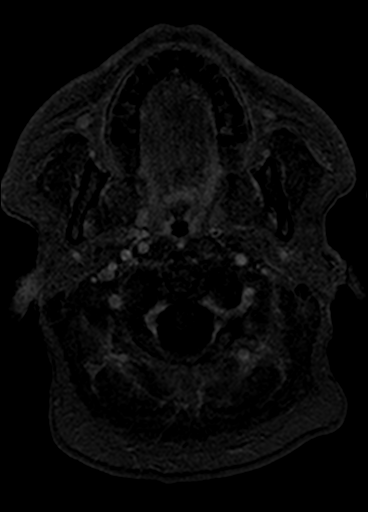

[37 of 48 positions shown; findings below may reference images not displayed]

FINDINGS: Brain: There is no evidence of acute infarct, intracranial
hemorrhage, mass, midline shift, or extra-axial fluid collection.
There is mild-to-moderate cerebral atrophy. No significant white
matter disease is evident. No abnormal enhancement is identified.

Dedicated imaging through the internal auditory canals demonstrates
a normal course of cranial nerves VII and VIII without evidence of
mass or abnormal enhancement. Inner ear structures demonstrate
normal signal bilaterally. No mass is seen within the
cerebellopontine angles.

Vascular: Major intracranial vascular flow voids are preserved.

Skull and upper cervical spine: Unremarkable bone marrow signal.

Sinuses/Orbits: Unremarkable orbits. Minimal mucosal thickening in
the ethmoid sinuses. Trace mastoid effusions.

Other: None.
IMPRESSION: 1. Negative internal auditory canal imaging.
2. Mild-to-moderate cerebral atrophy.

## 2020-10-19 DIAGNOSIS — L821 Other seborrheic keratosis: Secondary | ICD-10-CM | POA: Diagnosis not present

## 2020-10-19 DIAGNOSIS — L814 Other melanin hyperpigmentation: Secondary | ICD-10-CM | POA: Diagnosis not present

## 2020-10-19 DIAGNOSIS — L570 Actinic keratosis: Secondary | ICD-10-CM | POA: Diagnosis not present

## 2020-10-19 DIAGNOSIS — L578 Other skin changes due to chronic exposure to nonionizing radiation: Secondary | ICD-10-CM | POA: Diagnosis not present

## 2020-10-26 DIAGNOSIS — Z23 Encounter for immunization: Secondary | ICD-10-CM | POA: Diagnosis not present

## 2020-11-23 ENCOUNTER — Encounter: Payer: Self-pay | Admitting: Neurology

## 2020-11-23 DIAGNOSIS — E871 Hypo-osmolality and hyponatremia: Secondary | ICD-10-CM | POA: Diagnosis not present

## 2020-11-23 DIAGNOSIS — R251 Tremor, unspecified: Secondary | ICD-10-CM | POA: Diagnosis not present

## 2020-11-23 DIAGNOSIS — N1831 Chronic kidney disease, stage 3a: Secondary | ICD-10-CM | POA: Diagnosis not present

## 2020-11-23 DIAGNOSIS — I129 Hypertensive chronic kidney disease with stage 1 through stage 4 chronic kidney disease, or unspecified chronic kidney disease: Secondary | ICD-10-CM | POA: Diagnosis not present

## 2020-12-04 NOTE — Progress Notes (Deleted)
Assessment/Plan:   ***  Subjective:   Christopher Mathis was seen today in the movement disorders clinic for neurologic consultation at the request of Reynold Bowen, MD.  The consultation is for the evaluation of tremor.  Medical records available to me are reviewed, including primary care records and his nephrology records from [facility].  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  Tremor: Yes.     How long has it been going on?  25 years  At rest or with activation?  ***  When is it noted the most?  ***  Fam hx of tremor?  Yes.   ,  Father, paternal grandfather  Located where?  ***  Affected by caffeine:  {yes no:314532}  Affected by alcohol:  {yes no:314532}  Affected by stress:  {yes no:314532}  Affected by fatigue:  {yes no:314532}  Spills soup if on spoon:  {yes no:314532}  Spills glass of liquid if full:  {yes no:314532}  Affects ADL's (tying shoes, brushing teeth, etc):  {yes no:314532}  Tremor inducing meds:  {yes no:314532}  Other Specific Symptoms:  Voice: *** Sleep: ***  Vivid Dreams:  {yes no:314532}  Acting out dreams:  {yes no:314532} Wet Pillows: {yes no:314532} Postural symptoms:  {yes no:314532}  Falls?  {yes no:314532} Bradykinesia symptoms: {parkinson brady:18041} Loss of smell:  {yes no:314532} Loss of taste:  {yes no:314532} Urinary Incontinence:  {yes no:314532} Difficulty Swallowing:  {yes no:314532} Handwriting, micrographia: {yes no:314532} Trouble with ADL's:  {yes no:314532}  Trouble buttoning clothing: {yes no:314532} Depression:  {yes no:314532} Memory changes:  {yes no:314532} Hallucinations:  {yes no:314532}  visual distortions: {yes no:314532} N/V:  {yes no:314532} Lightheaded:  {yes no:314532}  Syncope: {yes no:314532} Diplopia:  {yes no:314532} Dyskinesia:  {yes no:314532}  Neuroimaging  of the brain has *** previously been performed.  It *** available for my review today.  PREVIOUS MEDICATIONS: {Parkinson's RX:18200}  ALLERGIES:  No Known Allergies  CURRENT MEDICATIONS:  Current Outpatient Medications  Medication Instructions   albuterol (PROVENTIL HFA;VENTOLIN HFA) 108 (90 BASE) MCG/ACT inhaler 2 puffs, Inhalation, Every 4 hours PRN   amoxicillin (AMOXIL) 875 mg, Oral, 2 times daily   fluticasone (FLONASE) 50 MCG/ACT nasal spray Place two sprays in each nostril once daily   losartan (COZAAR) 100 mg, Daily   mycophenolate (CELLCEPT) 1,000 mg, Oral, 2 times daily   predniSONE (DELTASONE) 5 mg, Oral, Daily with breakfast   predniSONE (STERAPRED UNI-PAK) 10 MG tablet Take with food for 6 days.-Take 6 on day 1, 5 on day 2, 4 on day 3, then 3 tablets on day 4, then 2 tablets on day 5, then 1 on day 6.   tacrolimus (PROGRAF) 1.5 mg, Oral, 2 times daily    Objective:   PHYSICAL EXAMINATION:    VITALS:  There were no vitals filed for this visit.  GEN:  The patient appears stated age and is in NAD. HEENT:  Normocephalic, atraumatic.  The mucous membranes are moist. The superficial temporal arteries are without ropiness or tenderness. CV:  RRR Lungs:  CTAB Neck/HEME:  There are no carotid bruits bilaterally.  Neurological examination:  Orientation: The patient is alert and oriented x3.  Cranial nerves: There is good facial symmetry.  Extraocular muscles are intact. The visual fields are full to confrontational testing. The speech is fluent and clear. Soft palate rises symmetrically and there is no tongue deviation. Hearing is intact  to conversational tone. Sensation: Sensation is intact to light touch throughout (facial, trunk, extremities). Vibration is intact at the bilateral big toe. There is no extinction with double simultaneous stimulation.  Motor: Strength is 5/5 in the bilateral upper and lower extremities.   Shoulder shrug is equal and symmetric.  There is no  pronator drift. Deep tendon reflexes: Deep tendon reflexes are 2/4 at the bilateral biceps, triceps, brachioradialis, patella and achilles. Plantar responses are downgoing bilaterally.  Movement examination: Tone: There is ***tone in the bilateral upper extremities.  The tone in the lower extremities is ***.  Abnormal movements: *** Coordination:  There is *** decremation with RAM's, *** Gait and Station: The patient has *** difficulty arising out of a deep-seated chair without the use of the hands. The patient's stride length is ***.  The patient has a *** pull test.       CBC on July 21, 2020 with white blood cell 6.0, hemoglobin 13.5, hematocrit 39.4 and platelets 239.  Chemistry at that same day with potassium 4.2, chloride 103, CO2 26, BUN 15, creatinine 1.27.  AST 37.  ALT 42  Total time spent on today's visit was ***greater than 60 minutes, including both face-to-face time and nonface-to-face time.  Time included that spent on review of records (prior notes available to me/labs/imaging if pertinent), discussing treatment and goals, answering patient's questions and coordinating care.  Cc:  Reynold Bowen, MD

## 2020-12-12 ENCOUNTER — Ambulatory Visit: Payer: Self-pay | Admitting: Neurology

## 2020-12-12 NOTE — Progress Notes (Signed)
Assessment/Plan:   Essential Tremor.  -This is evidenced by the symmetrical nature and longstanding hx of gradually getting worse.  He also has a strong family history of this.  We discussed nature and pathophysiology.  We discussed that this can continue to gradually get worse with time.  We discussed that some medications can worsen this, as can caffeine use.  We discussed medication therapy as well as surgical therapy.  Ultimately, the patient would like to try medication therapy.  Specifically, he would like to try the beta-blocker, as he has noticed his blood pressure is creeping up.  -pt sees nephrology due to hx of renal transplant years ago, in 2002.  I will call [facility] and make sure that they are okay with the beta-blocker.  He is already on lisinopril.  We could perhaps start with propranolol, 20 mg twice per day.  I told the patient we would call [facility] and try to get back to him by the end of the week.  He was okay with that.  -Patient education on essential tremor was provided.  Subjective:   Christopher Mathis was seen today in the movement disorders clinic for neurologic consultation at the request of Reynold Bowen, MD.  The consultation is for the evaluation of tremor.  Medical records available to me are reviewed, including primary care records and his nephrology records from [facility].  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  Tremor: Yes.     How long has it been going on?  25 years  At rest or with activation?  activation  When is it noted the most?  Fine motor skills but also eating/drinking  Fam hx of tremor?  Yes.   ,  Father (his tremors are significant), paternal grandfather  Located where?  Bilateral UE - he is R hand dominant  Affected by caffeine:  unknown (unknown - drinks 2 cups coffee/day)  Affected by  alcohol:  No.  Affected by stress:  Yes.    Affected by fatigue:  No.  Spills soup if on spoon:  "I have to be careful"  Spills glass of liquid if full:  No.  Affects ADL's (tying shoes, brushing teeth, etc):  No.  Tremor inducing meds:  no  Other Specific Symptoms:  Voice: no change Sleep: sleeps well Postural symptoms:  No., better with exercise Bradykinesia symptoms: no bradykinesia noted Loss of smell:  No. Loss of taste:  No. Urinary Incontinence:  No. Difficulty Swallowing:  No. Handwriting, micrographia: No. N/V:  No. Lightheaded:  No.  Syncope: No. Diplopia:  No. Dyskinesia:  No.  Neuroimaging of the brain has previously been performed.  Patient MRI brain with and without gadolinium in January, 2020.  There is mild atrophy.  There was no significant white matter disease.  I reviewed the images.   ALLERGIES:  No Known Allergies  CURRENT MEDICATIONS:  Current Outpatient Medications  Medication Instructions   lisinopril (ZESTRIL) 40 mg, Oral, Daily   mycophenolate (CELLCEPT) 1,000 mg, Oral, 2 times daily   tacrolimus (PROGRAF) 1.5 mg, Oral, 2 times daily    Objective:   PHYSICAL EXAMINATION:    VITALS:   Vitals:   12/25/20 0932  BP: (!) 151/81  Pulse: 78  SpO2: 98%  Weight: 191 lb 6.4 oz (86.8 kg)  Height: 5\' 10"  (1.778 m)    GEN:  The patient appears stated  age and is in NAD. HEENT:  Normocephalic, atraumatic.  The mucous membranes are moist. The superficial temporal arteries are without ropiness or tenderness. CV:  RRR Lungs:  CTAB Neck/HEME:  There are no carotid bruits bilaterally.  Neurological examination:  Orientation: The patient is alert and oriented x3.  Cranial nerves: There is good facial symmetry.  Extraocular muscles are intact. The visual fields are full to confrontational testing. The speech is fluent and clear. Soft palate rises symmetrically and there is no tongue deviation. Hearing is intact to conversational tone. Sensation:  Sensation is intact to light touch throughout (facial, trunk, extremities). Vibration is intact at the bilateral big toe. There is no extinction with double simultaneous stimulation.  Motor: Strength is 5/5 in the bilateral upper and lower extremities.   Shoulder shrug is equal and symmetric.  There is no pronator drift. Deep tendon reflexes: Deep tendon reflexes are 2/4 at the bilateral biceps, triceps, brachioradialis, 2+ - 3-at the bilateral patella and 2/4 at the bilateral achilles. Plantar responses are downgoing bilaterally.  Movement examination: Tone: There is normal tone in the bilateral upper extremities.  The tone in the lower extremities is normal.  Abnormal movements: No rest tremor.  There is mild tremor of the outstretched hands and with intention.  He does not have a lot of trouble with Archimedes spirals.  He does have some tremor when pouring water from one glass to another.  He does not spill the water. Coordination:  There is no decremation with RAM's, with any form of RAMS, including alternating supination and pronation of the forearm, hand opening and closing, finger taps, heel taps and toe taps. Gait and Station: The patient has no difficulty arising out of a deep-seated chair without the use of the hands. The patient's stride length is good.  He is able to ambulate in a tandem fashion.  He is able to stand in the Romberg position.    I have reviewed and interpreted the following labs independently   CBC on July 21, 2020 with white blood cell 6.0, hemoglobin 13.5, hematocrit 39.4 and platelets 239.  Chemistry at that same day with potassium 4.2, chloride 103, CO2 26, BUN 15, creatinine 1.27.  AST 37.  ALT 42  Total time spent on today's visit was 57minutes, including both face-to-face time and nonface-to-face time.  Time included that spent on review of records (prior notes available to me/labs/imaging if pertinent), discussing treatment and goals, answering patient's questions  and coordinating care.  Cc:  Reynold Bowen, MD

## 2020-12-25 ENCOUNTER — Ambulatory Visit (INDEPENDENT_AMBULATORY_CARE_PROVIDER_SITE_OTHER): Payer: BC Managed Care – PPO | Admitting: Neurology

## 2020-12-25 ENCOUNTER — Other Ambulatory Visit: Payer: Self-pay

## 2020-12-25 ENCOUNTER — Encounter: Payer: Self-pay | Admitting: Neurology

## 2020-12-25 VITALS — BP 151/81 | HR 78 | Ht 70.0 in | Wt 191.4 lb

## 2020-12-25 DIAGNOSIS — G250 Essential tremor: Secondary | ICD-10-CM | POA: Diagnosis not present

## 2020-12-25 DIAGNOSIS — D849 Immunodeficiency, unspecified: Secondary | ICD-10-CM | POA: Diagnosis not present

## 2020-12-25 DIAGNOSIS — Z940 Kidney transplant status: Secondary | ICD-10-CM

## 2020-12-25 NOTE — Patient Instructions (Signed)
Essential Tremor °A tremor is trembling or shaking that a person cannot control. Most tremors affect the hands or arms. Tremors can also affect the head, vocal cords, legs, and other parts of the body. Essential tremor is a tremor without a known cause. Usually, it occurs while a person is trying to perform an action. It tends to get worse gradually as a person ages. °What are the causes? °The cause of this condition is not known. °What increases the risk? °You are more likely to develop this condition if: °You have a family member with essential tremor. °You are age 40 or older. °You take certain medicines. °What are the signs or symptoms? °The main sign of a tremor is a rhythmic shaking of certain parts of your body that is uncontrolled and unintentional. You may: °Have difficulty eating with a spoon or fork. °Have difficulty writing. °Nod your head up and down or side to side. °Have a quivering voice. °The shaking may: °Get worse over time. °Come and go. °Be more noticeable on one side of your body. °Get worse due to stress, fatigue, caffeine, and extreme heat or cold. °How is this diagnosed? °This condition may be diagnosed based on: °Your symptoms and medical history. °A physical exam. °There is no single test to diagnose an essential tremor. However, your health care provider may order tests to rule out other causes of your condition. These may include: °Blood and urine tests. °Imaging studies of your brain, such as CT scan and MRI. °A test that measures involuntary muscle movement (electromyogram). °How is this treated? °Treatment for essential tremor depends on the severity of the condition. °Some tremors may go away without treatment. °Mild tremors may not need treatment if they do not affect your day-to-day life. °Severe tremors may need to be treated using one or more of the following options: °Medicines. °Lifestyle changes. °Occupational or physical therapy. °Follow these instructions at  home: °Lifestyle ° °Do not use any products that contain nicotine or tobacco, such as cigarettes and e-cigarettes. If you need help quitting, ask your health care provider. °Limit your caffeine intake as told by your health care provider. °Try to get 8 hours of sleep each night. °Find ways to manage your stress that fits your lifestyle and personality. Consider trying meditation or yoga. °Try to anticipate stressful situations and allow extra time to manage them. °If you are struggling emotionally with the effects of your tremor, consider working with a mental health provider. °General instructions °Take over-the-counter and prescription medicines only as told by your health care provider. °Avoid extreme heat and extreme cold. °Keep all follow-up visits as told by your health care provider. This is important. Visits may include physical therapy visits. °Contact a health care provider if: °You experience any changes in the location or intensity of your tremors. °You start having a tremor after starting a new medicine. °You have tremor with other symptoms, such as: °Numbness. °Tingling. °Pain. °Weakness. °Your tremor gets worse. °Your tremor interferes with your daily life. °You feel down, blue, or sad for at least 2 weeks in a row. °Worrying about your tremor and what other people think about you interferes with your everyday life functions, including relationships, work, or school. °Summary °Essential tremor is a tremor without a known cause. Usually, it occurs when you are trying to perform an action. °You are more likely to develop this condition if you have a family member with essential tremor. °The main sign of a tremor is a rhythmic shaking of   certain parts of your body that is uncontrolled and unintentional. °Treatment for essential tremor depends on the severity of the condition. °This information is not intended to replace advice given to you by your health care provider. Make sure you discuss any questions  you have with your health care provider. °Document Revised: 09/22/2019 Document Reviewed: 09/24/2019 °Elsevier Patient Education © 2022 [employer] ° °

## 2020-12-27 DIAGNOSIS — I788 Other diseases of capillaries: Secondary | ICD-10-CM | POA: Diagnosis not present

## 2020-12-27 DIAGNOSIS — L814 Other melanin hyperpigmentation: Secondary | ICD-10-CM | POA: Diagnosis not present

## 2020-12-27 DIAGNOSIS — D2262 Melanocytic nevi of left upper limb, including shoulder: Secondary | ICD-10-CM | POA: Diagnosis not present

## 2021-01-01 ENCOUNTER — Telehealth: Payer: Self-pay | Admitting: Neurology

## 2021-01-01 NOTE — Telephone Encounter (Signed)
Called patient and Left a voicemail message about the medication being approved I needed to verify with patient and verify pharmacy

## 2021-01-01 NOTE — Telephone Encounter (Signed)
Patients pharmacy is [address] [place] [facility]

## 2021-01-01 NOTE — Telephone Encounter (Signed)
Chelsea, let pt know and call in propranolol 20 mg bid (AM and late afternoon).

## 2021-01-01 NOTE — Telephone Encounter (Signed)
Janett Billow from [facility] called back to let Vikki Ports know its okay for patient to start propanolol.

## 2021-01-02 ENCOUNTER — Other Ambulatory Visit: Payer: Self-pay

## 2021-01-02 DIAGNOSIS — G250 Essential tremor: Secondary | ICD-10-CM

## 2021-01-02 MED ORDER — PROPRANOLOL HCL 20 MG PO TABS: 20.0000 mg | ORAL_TABLET | Freq: Two times a day (BID) | ORAL | 0 refills | Status: DC

## 2021-01-02 NOTE — Telephone Encounter (Signed)
Prescription  for propranolol has been sent in to the [facility] [address] for patient

## 2021-01-22 DIAGNOSIS — T8610 Unspecified complication of kidney transplant: Secondary | ICD-10-CM | POA: Diagnosis not present

## 2021-01-22 DIAGNOSIS — Z79899 Other long term (current) drug therapy: Secondary | ICD-10-CM | POA: Diagnosis not present

## 2021-01-22 DIAGNOSIS — D849 Immunodeficiency, unspecified: Secondary | ICD-10-CM | POA: Diagnosis not present

## 2021-01-22 DIAGNOSIS — Y830 Surgical operation with transplant of whole organ as the cause of abnormal reaction of the patient, or of later complication, without mention of misadventure at the time of the procedure: Secondary | ICD-10-CM | POA: Diagnosis not present

## 2021-02-02 DIAGNOSIS — D849 Immunodeficiency, unspecified: Secondary | ICD-10-CM | POA: Diagnosis not present

## 2021-02-02 DIAGNOSIS — Z940 Kidney transplant status: Secondary | ICD-10-CM | POA: Diagnosis not present

## 2021-02-02 DIAGNOSIS — R808 Other proteinuria: Secondary | ICD-10-CM | POA: Diagnosis not present

## 2021-02-20 DIAGNOSIS — D849 Immunodeficiency, unspecified: Secondary | ICD-10-CM | POA: Diagnosis not present

## 2021-02-20 DIAGNOSIS — Z940 Kidney transplant status: Secondary | ICD-10-CM | POA: Diagnosis not present

## 2021-02-20 DIAGNOSIS — Z5181 Encounter for therapeutic drug level monitoring: Secondary | ICD-10-CM | POA: Diagnosis not present

## 2021-02-20 DIAGNOSIS — R808 Other proteinuria: Secondary | ICD-10-CM | POA: Diagnosis not present

## 2021-02-21 DIAGNOSIS — D849 Immunodeficiency, unspecified: Secondary | ICD-10-CM | POA: Diagnosis not present

## 2021-02-21 DIAGNOSIS — Z6825 Body mass index (BMI) 25.0-25.9, adult: Secondary | ICD-10-CM | POA: Diagnosis not present

## 2021-02-21 DIAGNOSIS — R808 Other proteinuria: Secondary | ICD-10-CM | POA: Diagnosis not present

## 2021-02-21 DIAGNOSIS — Z940 Kidney transplant status: Secondary | ICD-10-CM | POA: Diagnosis not present

## 2021-03-06 DIAGNOSIS — D492 Neoplasm of unspecified behavior of bone, soft tissue, and skin: Secondary | ICD-10-CM | POA: Diagnosis not present

## 2021-03-06 DIAGNOSIS — D044 Carcinoma in situ of skin of scalp and neck: Secondary | ICD-10-CM | POA: Diagnosis not present

## 2021-03-06 DIAGNOSIS — L570 Actinic keratosis: Secondary | ICD-10-CM | POA: Diagnosis not present

## 2021-03-06 DIAGNOSIS — C44622 Squamous cell carcinoma of skin of right upper limb, including shoulder: Secondary | ICD-10-CM | POA: Diagnosis not present

## 2021-03-30 ENCOUNTER — Other Ambulatory Visit: Payer: Self-pay | Admitting: Neurology

## 2021-03-30 DIAGNOSIS — G250 Essential tremor: Secondary | ICD-10-CM

## 2021-04-03 ENCOUNTER — Other Ambulatory Visit: Payer: Self-pay

## 2021-04-05 DIAGNOSIS — L578 Other skin changes due to chronic exposure to nonionizing radiation: Secondary | ICD-10-CM | POA: Diagnosis not present

## 2021-04-05 DIAGNOSIS — L570 Actinic keratosis: Secondary | ICD-10-CM | POA: Diagnosis not present

## 2021-04-05 DIAGNOSIS — Z85828 Personal history of other malignant neoplasm of skin: Secondary | ICD-10-CM | POA: Diagnosis not present

## 2021-04-14 DIAGNOSIS — J189 Pneumonia, unspecified organism: Secondary | ICD-10-CM | POA: Diagnosis not present

## 2021-05-11 DIAGNOSIS — E559 Vitamin D deficiency, unspecified: Secondary | ICD-10-CM | POA: Diagnosis not present

## 2021-05-11 DIAGNOSIS — D849 Immunodeficiency, unspecified: Secondary | ICD-10-CM | POA: Diagnosis not present

## 2021-05-11 DIAGNOSIS — R5383 Other fatigue: Secondary | ICD-10-CM | POA: Diagnosis not present

## 2021-05-11 DIAGNOSIS — Z79899 Other long term (current) drug therapy: Secondary | ICD-10-CM | POA: Diagnosis not present

## 2021-05-11 DIAGNOSIS — Z5181 Encounter for therapeutic drug level monitoring: Secondary | ICD-10-CM | POA: Diagnosis not present

## 2021-05-11 DIAGNOSIS — R808 Other proteinuria: Secondary | ICD-10-CM | POA: Diagnosis not present

## 2021-05-11 DIAGNOSIS — Z940 Kidney transplant status: Secondary | ICD-10-CM | POA: Diagnosis not present

## 2021-06-18 DIAGNOSIS — Z125 Encounter for screening for malignant neoplasm of prostate: Secondary | ICD-10-CM | POA: Diagnosis not present

## 2021-06-18 DIAGNOSIS — E785 Hyperlipidemia, unspecified: Secondary | ICD-10-CM | POA: Diagnosis not present

## 2021-06-19 DIAGNOSIS — E785 Hyperlipidemia, unspecified: Secondary | ICD-10-CM | POA: Diagnosis not present

## 2021-06-19 DIAGNOSIS — Z0000 Encounter for general adult medical examination without abnormal findings: Secondary | ICD-10-CM | POA: Diagnosis not present

## 2021-06-19 NOTE — Progress Notes (Signed)
    Assessment/Plan:    1.  Essential Tremor  -Patient currently on propranolol, 20 mg twice per day and will continue that  2.  History of renal transplant, 2002  -Patient follows with [facility] still.  3.  F/u 1 year  Subjective:   Christopher Mathis was seen today in follow up for essential tremor.  My previous records were reviewed prior to todays visit. Pt denies falls.  States that the propranolol has helped.  Tremor is not all gone but it is good enough and he is happy with degree of tremor control.  Pt denies lightheadedness, near syncope.    Current prescribed movement disorder medications: Propranolol, 20 mg twice daily (started last visit)   ALLERGIES:  No Known Allergies  CURRENT MEDICATIONS:  Current Meds  Medication Sig   lisinopril (ZESTRIL) 40 MG tablet Take 40 mg by mouth daily.   mycophenolate (CELLCEPT) 500 MG tablet Take 1,000 mg by mouth 2 (two) times daily.    propranolol (INDERAL) 20 MG tablet TAKE 1 TABLET BY MOUTH IN THE MORNING AND 1 TABLET LATE AFTERNOON   tacrolimus (PROGRAF) 1 MG capsule Take 1.5 mg by mouth 2 (two) times daily.       Objective:    PHYSICAL EXAMINATION:    VITALS:   Vitals:   06/22/21 0806  BP: 138/62  Pulse: 70  SpO2: 99%  Weight: 180 lb 6.4 oz (81.8 kg)  Height: '5\' 10"'$  (1.778 m)    GEN:  The patient appears stated age and is in NAD. HEENT:  Normocephalic, atraumatic.  The mucous membranes are moist. The superficial temporal arteries are without ropiness or tenderness. CV:  rrr Lungs:  ctab Neck/HEME: no bruits  Neurological examination:  Orientation: The patient is alert and oriented x3. Cranial nerves: There is good facial symmetry. The speech is fluent and clear. Soft palate rises symmetrically and there is no tongue deviation. Hearing is intact to conversational tone. Sensation: Sensation is intact to light touch throughout Motor: Strength is at least antigravity x4.  Movement examination: Tone: There  is normal tone in the bilateral upper extremities.  The tone in the lower extremities is normal.  Abnormal movements: No rest tremor.  There is no postural or intention tremor today.  Archimedes spirals done well today.  Gait and Station: ambulates well in hall    Cc:  Reynold Bowen, MD

## 2021-06-22 ENCOUNTER — Ambulatory Visit (INDEPENDENT_AMBULATORY_CARE_PROVIDER_SITE_OTHER): Payer: BC Managed Care – PPO | Admitting: Neurology

## 2021-06-22 ENCOUNTER — Encounter: Payer: Self-pay | Admitting: Neurology

## 2021-06-22 DIAGNOSIS — G250 Essential tremor: Secondary | ICD-10-CM | POA: Diagnosis not present

## 2021-06-22 MED ORDER — PROPRANOLOL HCL 20 MG PO TABS: ORAL_TABLET | ORAL | 3 refills | Status: DC

## 2021-06-22 NOTE — Patient Instructions (Signed)
Essential Tremor ?A tremor is trembling or shaking that a person cannot control. Most tremors affect the hands or arms. Tremors can also affect the head, vocal cords, legs, and other parts of the body. Essential tremor is a tremor without a known cause. Usually, it occurs while a person is trying to perform an action. It tends to get worse gradually as a person ages. ?What are the causes? ?The cause of this condition is not known, but it often runs in families. ?What increases the risk? ?You are more likely to develop this condition if: ?You have a family member with essential tremor. ?You are 40 years of age or older. ?What are the signs or symptoms? ?The main sign of a tremor is a rhythmic shaking of certain parts of your body that is uncontrolled and unintentional. You may: ?Have difficulty eating with a spoon or fork. ?Have difficulty writing. ?Nod your head up and down or side to side. ?Have a quivering voice. ?The shaking may: ?Get worse over time. ?Come and go. ?Be more noticeable on one side of your body. ?Get worse due to stress, tiredness (fatigue), caffeine, and extreme heat or cold. ?How is this diagnosed? ?This condition may be diagnosed based on: ?Your symptoms and medical history. ?A physical exam. ?There is no single test to diagnose an essential tremor. However, your health care provider may order tests to rule out other causes of your condition. These may include: ?Blood and urine tests. ?Imaging studies of your brain, such as a CT scan or MRI. ?How is this treated? ?Treatment for essential tremor depends on the severity of the condition. ?Mild tremors may not need treatment if they do not affect your day-to-day life. ?Severe tremors may need to be treated using one or more of the following options: ?Medicines. ?Injections of a substance called botulinum toxin. ?Procedures such as deep brain stimulation (DBS) implantation or MRI-guided ultrasound treatment. ?Lifestyle changes. ?Occupational or  physical therapy. ?Follow these instructions at home: ?Lifestyle ? ?Do not use any products that contain nicotine or tobacco. These products include cigarettes, chewing tobacco, and vaping devices, such as e-cigarettes. If you need help quitting, ask your health care provider. ?Limit your caffeine intake as told by your health care provider. ?Try to get 8 hours of sleep each night. ?Find ways to manage your stress that fit your lifestyle and personality. Consider trying meditation or yoga. ?Try to anticipate stressful situations and allow extra time to manage them. ?If you are struggling emotionally with the effects of your tremor, consider working with a mental health provider. ?General instructions ?Take over-the-counter and prescription medicines only as told by your health care provider. ?Avoid extreme heat and extreme cold. ?Keep all follow-up visits. This is important. Visits may include physical therapy visits. ?Where to find more information ?National Institute of Neurological Disorders and Stroke: www.ninds.nih.gov ?Contact a health care provider if: ?You experience any changes in the location or intensity of your tremors. ?You start having a tremor after starting a new medicine. ?You have a tremor with other symptoms, such as: ?Numbness. ?Tingling. ?Pain. ?Weakness. ?Your tremor gets worse. ?Your tremor interferes with your daily life. ?You feel down, blue, or sad for at least 2 weeks in a row. ?Worrying about your tremor and what other people think about you interferes with your everyday life functions, including relationships, work, or school. ?Summary ?Essential tremor is a tremor without a known cause. Usually, it occurs when you are trying to perform an action. ?You are more likely   to develop this condition if you have a family member with essential tremor. ?The main sign of a tremor is a rhythmic shaking of certain parts of your body that is uncontrolled and unintentional. ?Treatment for essential  tremor depends on the severity of the condition. ?This information is not intended to replace advice given to you by your health care provider. Make sure you discuss any questions you have with your health care provider. ?Document Revised: 10/20/2020 Document Reviewed: 10/20/2020 ?Elsevier Patient Education ? 2023 Elsevier Inc. ? ?

## 2021-06-25 DIAGNOSIS — Z0000 Encounter for general adult medical examination without abnormal findings: Secondary | ICD-10-CM | POA: Diagnosis not present

## 2021-06-25 DIAGNOSIS — E785 Hyperlipidemia, unspecified: Secondary | ICD-10-CM | POA: Diagnosis not present

## 2021-06-25 DIAGNOSIS — Z1331 Encounter for screening for depression: Secondary | ICD-10-CM | POA: Diagnosis not present

## 2021-06-25 DIAGNOSIS — R82998 Other abnormal findings in urine: Secondary | ICD-10-CM | POA: Diagnosis not present

## 2021-06-25 DIAGNOSIS — R251 Tremor, unspecified: Secondary | ICD-10-CM | POA: Diagnosis not present

## 2021-08-30 DIAGNOSIS — D849 Immunodeficiency, unspecified: Secondary | ICD-10-CM | POA: Diagnosis not present

## 2021-08-30 DIAGNOSIS — Z940 Kidney transplant status: Secondary | ICD-10-CM | POA: Diagnosis not present

## 2021-08-30 DIAGNOSIS — I129 Hypertensive chronic kidney disease with stage 1 through stage 4 chronic kidney disease, or unspecified chronic kidney disease: Secondary | ICD-10-CM | POA: Diagnosis not present

## 2021-08-30 DIAGNOSIS — Z79899 Other long term (current) drug therapy: Secondary | ICD-10-CM | POA: Diagnosis not present

## 2021-08-30 DIAGNOSIS — N022 Recurrent and persistent hematuria with diffuse membranous glomerulonephritis: Secondary | ICD-10-CM | POA: Diagnosis not present

## 2021-08-30 DIAGNOSIS — R809 Proteinuria, unspecified: Secondary | ICD-10-CM | POA: Diagnosis not present

## 2021-08-30 DIAGNOSIS — Z23 Encounter for immunization: Secondary | ICD-10-CM | POA: Diagnosis not present

## 2021-08-30 DIAGNOSIS — T8610 Unspecified complication of kidney transplant: Secondary | ICD-10-CM | POA: Diagnosis not present

## 2021-08-30 DIAGNOSIS — D899 Disorder involving the immune mechanism, unspecified: Secondary | ICD-10-CM | POA: Diagnosis not present

## 2021-08-30 DIAGNOSIS — Z792 Long term (current) use of antibiotics: Secondary | ICD-10-CM | POA: Diagnosis not present

## 2021-08-30 DIAGNOSIS — Z4822 Encounter for aftercare following kidney transplant: Secondary | ICD-10-CM | POA: Diagnosis not present

## 2021-08-31 DIAGNOSIS — Z79899 Other long term (current) drug therapy: Secondary | ICD-10-CM | POA: Diagnosis not present

## 2021-08-31 DIAGNOSIS — N022 Recurrent and persistent hematuria with diffuse membranous glomerulonephritis: Secondary | ICD-10-CM | POA: Diagnosis not present

## 2021-08-31 DIAGNOSIS — Z940 Kidney transplant status: Secondary | ICD-10-CM | POA: Diagnosis not present

## 2021-09-03 ENCOUNTER — Encounter: Payer: Self-pay | Admitting: Gastroenterology

## 2021-09-13 DIAGNOSIS — L219 Seborrheic dermatitis, unspecified: Secondary | ICD-10-CM | POA: Diagnosis not present

## 2021-09-13 DIAGNOSIS — L821 Other seborrheic keratosis: Secondary | ICD-10-CM | POA: Diagnosis not present

## 2021-09-13 DIAGNOSIS — Z85828 Personal history of other malignant neoplasm of skin: Secondary | ICD-10-CM | POA: Diagnosis not present

## 2021-09-13 DIAGNOSIS — L578 Other skin changes due to chronic exposure to nonionizing radiation: Secondary | ICD-10-CM | POA: Diagnosis not present

## 2021-09-13 DIAGNOSIS — L570 Actinic keratosis: Secondary | ICD-10-CM | POA: Diagnosis not present

## 2021-09-13 DIAGNOSIS — Z9225 Personal history of immunosupression therapy: Secondary | ICD-10-CM | POA: Diagnosis not present

## 2021-09-14 ENCOUNTER — Ambulatory Visit (AMBULATORY_SURGERY_CENTER): Payer: Self-pay

## 2021-09-14 VITALS — Ht 70.0 in | Wt 187.0 lb

## 2021-09-14 DIAGNOSIS — Z1211 Encounter for screening for malignant neoplasm of colon: Secondary | ICD-10-CM

## 2021-09-14 MED ORDER — NA SULFATE-K SULFATE-MG SULF 17.5-3.13-1.6 GM/177ML PO SOLN: 1.0000 | ORAL | 0 refills | Status: DC

## 2021-09-14 NOTE — Progress Notes (Signed)
No egg or soy allergy known to patient  No issues known to pt with past sedation with any surgeries or procedures Patient denies ever being told they had issues or difficulty with intubation  No FH of Malignant Hyperthermia Pt is not on diet pills Pt is not on  home 02  Pt is not on blood thinners  Pt denies issues with constipation  No A fib or A flutter Have any cardiac testing pending--denied Pt instructed to use Singlecare.com or GoodRx for a price reduction on prep   

## 2021-09-18 ENCOUNTER — Ambulatory Visit (INDEPENDENT_AMBULATORY_CARE_PROVIDER_SITE_OTHER): Payer: BC Managed Care – PPO | Admitting: Podiatry

## 2021-09-18 DIAGNOSIS — L600 Ingrowing nail: Secondary | ICD-10-CM

## 2021-09-18 DIAGNOSIS — B351 Tinea unguium: Secondary | ICD-10-CM | POA: Diagnosis not present

## 2021-09-18 NOTE — Progress Notes (Signed)
Subjective:   Patient ID: Christopher Mathis, male   DOB: 66 y.o.   MRN: [id number]   HPI Chief Complaint  Patient presents with   Ingrown Toenail    Rm 13 Bilateral ingrown. Right lateral hallux and left medial hallux intermittent x 10 years. Soreness, redness. No drainage or bleeding.     66 y.o. male presents with the above complaints. He states it depends on how he cuts the nails. He likes to exercise and the nails prevent him from doing this. No drainage or pus from the nails. He states the nails are "pretty calm" right now.  He points on the right side lateral aspect and the left lateral that is the most symptomatic.    Review of Systems  All other systems reviewed and are negative.  Past Medical History:  Diagnosis Date   Essential tremor    Hypertension    Renal disorder    Kidney transplant 01/30/00    Past Surgical History:  Procedure Laterality Date   INGUINAL HERNIA REPAIR Right    NEPHRECTOMY TRANSPLANTED ORGAN       Current Outpatient Medications:    clobetasol (TEMOVATE) 0.05 % external solution, SMARTSIG:In Ear(s), Disp: , Rfl:    lisinopril (ZESTRIL) 40 MG tablet, Take 40 mg by mouth daily., Disp: , Rfl:    mycophenolate (CELLCEPT) 500 MG tablet, Take 1,000 mg by mouth 2 (two) times daily. , Disp: , Rfl:    Na Sulfate-K Sulfate-Mg Sulf 17.5-3.13-1.6 GM/177ML SOLN, Take 1 kit by mouth as directed. May use generic Suprep, no prior authorization. Take as directed., Disp: 354 mL, Rfl: 0   tacrolimus (PROGRAF) 1 MG capsule, Take 1.5 mg by mouth 2 (two) times daily. , Disp: , Rfl:   No Known Allergies        Objective:  Physical Exam General: AAO x3, NAD  Dermatological: Incurvation present to both the medial and lateral aspects of bilateral hallux toenails.  The most symptomatic on the right lateral, left medial.  On the corners the nails are hypertrophic and there is yellow, brown discoloration there is also some white discoloration present.  There is no  edema, erythema or signs of infection at this time.  Vascular: Dorsalis Pedis artery and Posterior Tibial artery pedal pulses are 2/4 bilateral with immedate capillary fill time. There is no pain with calf compression, swelling, warmth, erythema.   Neruologic: Grossly intact via light touch bilateral.   Musculoskeletal: Tenderness to the ingrown toenails.  No other areas of discomfort.  Muscular strength 5/5 in all groups tested bilateral.  Gait: Unassisted, Nonantalgic.      Assessment:   Ingrown toenail     Plan:  -Treatment options discussed including all alternatives, risks, and complications -Etiology of symptoms were discussed -We discussed conservative care versus excision of the ingrown toenails with chemical matricectomy.  He has been to proceed with this but will reschedule for next week he has not upcoming golf trip this weekend.  Today I sharply debrided the nails without any complications or bleeding.  Also given the thickening discoloration of the nail borders I sent the nail for culture, pathology to [facility] labs.  -Will scheduled for AP nails bilateral hallux medial, lateral nail borders.  Trula Slade DPM

## 2021-09-18 NOTE — Patient Instructions (Signed)

## 2021-09-25 ENCOUNTER — Ambulatory Visit (INDEPENDENT_AMBULATORY_CARE_PROVIDER_SITE_OTHER): Payer: BC Managed Care – PPO | Admitting: Podiatry

## 2021-09-25 DIAGNOSIS — L600 Ingrowing nail: Secondary | ICD-10-CM | POA: Diagnosis not present

## 2021-09-25 NOTE — Patient Instructions (Signed)

## 2021-09-28 NOTE — Progress Notes (Signed)
Subjective: Chief Complaint  Patient presents with   Ingrown Toenail    Bilateral ingrown hallux toe nails removal,    66 year old male presents the office today for ingrown toenails of both of his big toes.  Right foot is hurting more than the left.  No redness or drainage.  No open lesions.  No other concerns.  Objective: AAO x3, NAD DP/PT pulses palpable bilaterally, CRT less than 3 seconds Incurvation present bilateral hallux toenails with the medial lateral nail borders with localized edema there is no erythema, drainage or pus or signs of infection.  Tenderness palpation within the right hallux toenail. No pain with calf compression, swelling, warmth, erythema  Assessment:  Plan: -All treatment options discussed with the patient including all alternatives, risks, complications.  -At this time, the patient is requesting partial nail removal with chemical matricectomy to the symptomatic portion of the nail. Risks and complications were discussed with the patient for which they understand and written consent was obtained. Under sterile conditions a total of 3 mL of a mixture of 2% lidocaine plain and 0.5% Marcaine plain was infiltrated in a hallux block fashion. Once anesthetized, the skin was prepped in sterile fashion. A tourniquet was then applied. Next the medial, lateral aspect of hallux nail border was then sharply excised making sure to remove the entire offending nail border. Once the nails were ensured to be removed area was debrided and the underlying skin was intact. There is no purulence identified in the procedure. Next phenol was then applied under standard conditions and copiously irrigated. Silvadene was applied. A dry sterile dressing was applied. After application of the dressing the tourniquet was removed and there is found to be an immediate capillary refill time to the digit. The patient tolerated the procedure well any complications. Post procedure instructions were  discussed the patient for which he verbally understood. Follow-up in one week for nail check or sooner if any problems are to arise. Discussed signs/symptoms of infection and directed to call the office immediately should any occur or go directly to the emergency room. In the meantime, encouraged to call the office with any questions, concerns, changes symptoms. -Patient encouraged to call the office with any questions, concerns, change in symptoms.   Trula Slade DPM

## 2021-10-04 ENCOUNTER — Encounter: Payer: Self-pay | Admitting: Certified Registered Nurse Anesthetist

## 2021-10-08 ENCOUNTER — Encounter: Payer: Self-pay | Admitting: Podiatry

## 2021-10-11 ENCOUNTER — Encounter: Payer: Self-pay | Admitting: Gastroenterology

## 2021-10-11 ENCOUNTER — Ambulatory Visit: Payer: BC Managed Care – PPO | Admitting: Gastroenterology

## 2021-10-11 VITALS — BP 119/67 | HR 57 | Temp 96.2°F | Resp 21 | Ht 70.0 in | Wt 187.0 lb

## 2021-10-11 DIAGNOSIS — Z1211 Encounter for screening for malignant neoplasm of colon: Secondary | ICD-10-CM

## 2021-10-11 MED ORDER — SODIUM CHLORIDE 0.9 % IV SOLN: 500.0000 mL | Freq: Once | INTRAVENOUS | Status: DC

## 2021-10-11 NOTE — Op Note (Addendum)
[place] Patient Name: Christopher Mathis Procedure Date: 10/11/2021 8:56 AM MRN: [id number] Endoscopist: Thornton Park MD, MD Age: 66 Referring MD:  Date of Birth: [birth year] Gender: Male Account #: [id number] Procedure:                Colonoscopy Indications:              Screening for colorectal malignant neoplasm                           Normal colonoscopy 2012 Medicines:                Monitored Anesthesia Care Procedure:                Pre-Anesthesia Assessment:                           - Prior to the procedure, a History and Physical                            was performed, and patient medications and                            allergies were reviewed. The patient's tolerance of                            previous anesthesia was also reviewed. The risks                            and benefits of the procedure and the sedation                            options and risks were discussed with the patient.                            All questions were answered, and informed consent                            was obtained. Prior Anticoagulants: The patient has                            taken no previous anticoagulant or antiplatelet                            agents. ASA Grade Assessment: III - A patient with                            severe systemic disease. After reviewing the risks                            and benefits, the patient was deemed in                            satisfactory condition to undergo the procedure.  After obtaining informed consent, the colonoscope                            was passed under direct vision. Throughout the                            procedure, the patient's blood pressure, pulse, and                            oxygen saturations were monitored continuously. The                            Olympus CF-HQ190L ([id number]) Colonoscope was                            introduced through the anus and advanced to  the the                            cecum, identified by appendiceal orifice and                            ileocecal valve. The colonoscopy was performed with                            moderate difficulty due to poor bowel prep. The                            patient tolerated the procedure well. The quality                            of the bowel preparation was poor. Over 105m of                            liquid and semi-solid stool was aspirated during                            the procedure. Despite these efforts, the entire                            colonic mucosa could not be visualized. The                            ileocecal valve, appendiceal orifice, and rectum                            were photographed. Scope In: 9:07:50 AM Scope Out: 9:20:54 AM Scope Withdrawal Time: 0 hours 5 minutes 59 seconds  Total Procedure Duration: 0 hours 13 minutes 4 seconds  Findings:                 Non-bleeding external and internal hemorrhoids were                            found.  A moderate amount of liquid semi-liquid stool was                            found in the entire colon, interfering with                            visualization. Lavage of the area was performed                            using greater than 500 mL, resulting in incomplete                            clearance with fair visualization. Complications:            No immediate complications. Estimated Blood Loss:     Estimated blood loss: none. Impression:               - Preparation of the colon was poor.                           - Non-bleeding external and internal hemorrhoids.                           - Stool in the entire examined colon. Small, flat,                            and in some areas medium sized polyps, may have                            been missed on this procedure due to retained stool.                           - No specimens collected. Recommendation:           -  Patient has a contact number available for                            emergencies. The signs and symptoms of potential                            delayed complications were discussed with the                            patient. Return to normal activities tomorrow.                            Written discharge instructions were provided to the                            patient.                           - Resume previous diet.                           - Continue present  medications.                           - Repeat colonoscopy at the next available                            appointment because the bowel preparation was poor.                            Two day bowel prep recommended at that time.                           - Emerging evidence supports eating a diet of                            fruits, vegetables, grains, calcium, and yogurt                            while reducing red meat and alcohol may reduce the                            risk of colon cancer. Thornton Park MD, MD 10/11/2021 9:26:19 AM This report has been signed electronically.

## 2021-10-11 NOTE — Progress Notes (Signed)
Patient provided with Plenvu sample ([id number]) to continue prepping today and return 9/29 for a colonoscopy with Dr. Candis Schatz.  Instructions provided to patient and gone over.  Patient verbalized understanding.

## 2021-10-11 NOTE — Progress Notes (Signed)
   Referring Provider: Reynold Bowen, MD Primary Care Physician:  Reynold Bowen, MD   Indication for Colonoscopy:  Colon cancer screening   IMPRESSION:  Need for colon cancer screening Appropriate candidate for monitored anesthesia care  PLAN: Colonoscopy in the [place] today   HPI: Christopher Mathis is a 66 y.o. male presents for screening colonoscopy.  Colonoscopy 2012.   No known family history of colon cancer or polyps. No family history of uterine/endometrial cancer, pancreatic cancer or gastric/stomach cancer.   Past Medical History:  Diagnosis Date   Essential tremor    Hypertension    Renal disorder    Kidney transplant 01/30/00    Past Surgical History:  Procedure Laterality Date   INGUINAL HERNIA REPAIR Right    NEPHRECTOMY TRANSPLANTED ORGAN      Current Outpatient Medications  Medication Sig Dispense Refill   lisinopril (ZESTRIL) 40 MG tablet Take 40 mg by mouth daily.     losartan (COZAAR) 100 MG tablet Take 100 mg by mouth daily.     mycophenolate (CELLCEPT) 500 MG tablet Take 1,000 mg by mouth 2 (two) times daily.      tacrolimus (PROGRAF) 0.5 MG capsule Take 0.5 mg by mouth 2 (two) times daily.     tacrolimus (PROGRAF) 1 MG capsule Take 1.5 mg by mouth 2 (two) times daily.      propranolol (INDERAL) 20 MG tablet Take 20 mg by mouth 2 (two) times daily.     Current Facility-Administered Medications  Medication Dose Route Frequency Provider Last Rate Last Admin   0.9 %  sodium chloride infusion  500 mL Intravenous Once Thornton Park, MD        Allergies as of 10/11/2021   (No Known Allergies)    Family History  Problem Relation Age of Onset   Breast cancer Mother    Tremor Father    Colon cancer Neg Hx    Colon polyps Neg Hx    Esophageal cancer Neg Hx    Rectal cancer Neg Hx    Stomach cancer Neg Hx      Physical Exam: General:   Alert,  well-nourished, pleasant and cooperative in NAD Head:  Normocephalic and atraumatic. Eyes:  Sclera  clear, no icterus.   Conjunctiva pink. Mouth:  No deformity or lesions.   Neck:  Supple; no masses or thyromegaly. Lungs:  Clear throughout to auscultation.   No wheezes. Heart:  Regular rate and rhythm; no murmurs. Abdomen:  Soft, non-tender, nondistended, normal bowel sounds, no rebound or guarding.  Msk:  Symmetrical. No boney deformities LAD: No inguinal or umbilical LAD Extremities:  No clubbing or edema. Neurologic:  Alert and  oriented x4;  grossly nonfocal Skin:  No obvious rash or bruise. Psych:  Alert and cooperative. Normal mood and affect.     Studies/Results: No results found.    [name], MD, MPH 10/11/2021, 8:59 AM

## 2021-10-11 NOTE — Progress Notes (Signed)
Pt's states no medical or surgical changes since previsit or office visit. VS by CW. 

## 2021-10-11 NOTE — Progress Notes (Signed)
Report given to PACU, vss 

## 2021-10-11 NOTE — Patient Instructions (Signed)
Continue present medications. Repeat colonoscopy tomorrow.   YOU HAD AN ENDOSCOPIC PROCEDURE TODAY AT [facility]:   Refer to the procedure report that was given to you for any specific questions about what was found during the examination.  If the procedure report does not answer your questions, please call your gastroenterologist to clarify.  If you requested that your care partner not be given the details of your procedure findings, then the procedure report has been included in a sealed envelope for you to review at your convenience later.  YOU SHOULD EXPECT: Some feelings of bloating in the abdomen. Passage of more gas than usual.  Walking can help get rid of the air that was put into your GI tract during the procedure and reduce the bloating. If you had a lower endoscopy (such as a colonoscopy or flexible sigmoidoscopy) you may notice spotting of blood in your stool or on the toilet paper. If you underwent a bowel prep for your procedure, you may not have a normal bowel movement for a few days.  Please Note:  You might notice some irritation and congestion in your nose or some drainage.  This is from the oxygen used during your procedure.  There is no need for concern and it should clear up in a day or so.  SYMPTOMS TO REPORT IMMEDIATELY:  Following lower endoscopy (colonoscopy or flexible sigmoidoscopy):  Excessive amounts of blood in the stool  Significant tenderness or worsening of abdominal pains  Swelling of the abdomen that is new, acute  Fever of 100F or higher   For urgent or emergent issues, a gastroenterologist can be reached at any hour by calling [phone]. Do not use MyChart messaging for urgent concerns.    DIET: Drink plenty of fluids but you should avoid alcoholic beverages for 24 hours.  ACTIVITY:  You should plan to take it easy for the rest of today and you should NOT DRIVE or use heavy machinery until tomorrow (because of the sedation  medicines used during the test).    FOLLOW UP: Our staff will call the number listed on your records the next business day following your procedure.  We will call around 7:15- 8:00 am to check on you and address any questions or concerns that you may have regarding the information given to you following your procedure. If we do not reach you, we will leave a message.     If any biopsies were taken you will be contacted by phone or by letter within the next 1-3 weeks.  Please call us at [phone] if you have not heard about the biopsies in 3 weeks.    SIGNATURES/CONFIDENTIALITY: You and/or your care partner have signed paperwork which will be entered into your electronic medical record.  These signatures attest to the fact that that the information above on your After Visit Summary has been reviewed and is understood.  Full responsibility of the confidentiality of this discharge information lies with you and/or your care-partner.

## 2021-10-12 ENCOUNTER — Ambulatory Visit (AMBULATORY_SURGERY_CENTER): Payer: BC Managed Care – PPO | Admitting: Gastroenterology

## 2021-10-12 ENCOUNTER — Encounter: Payer: Self-pay | Admitting: Gastroenterology

## 2021-10-12 VITALS — BP 110/62 | HR 79 | Temp 96.8°F | Resp 19 | Ht 70.0 in | Wt 187.0 lb

## 2021-10-12 DIAGNOSIS — Z1211 Encounter for screening for malignant neoplasm of colon: Secondary | ICD-10-CM | POA: Diagnosis not present

## 2021-10-12 MED ORDER — SODIUM CHLORIDE 0.9 % IV SOLN: 500.0000 mL | Freq: Once | INTRAVENOUS | Status: DC

## 2021-10-12 NOTE — Patient Instructions (Signed)
Resume previous diet and medications. Repeat Colonoscopy in 10 years for surveillance.  YOU HAD AN ENDOSCOPIC PROCEDURE TODAY AT THE [facility]:   Refer to the procedure report that was given to you for any specific questions about what was found during the examination.  If the procedure report does not answer your questions, please call your gastroenterologist to clarify.  If you requested that your care partner not be given the details of your procedure findings, then the procedure report has been included in a sealed envelope for you to review at your convenience later.  YOU SHOULD EXPECT: Some feelings of bloating in the abdomen. Passage of more gas than usual.  Walking can help get rid of the air that was put into your GI tract during the procedure and reduce the bloating. If you had a lower endoscopy (such as a colonoscopy or flexible sigmoidoscopy) you may notice spotting of blood in your stool or on the toilet paper. If you underwent a bowel prep for your procedure, you may not have a normal bowel movement for a few days.  Please Note:  You might notice some irritation and congestion in your nose or some drainage.  This is from the oxygen used during your procedure.  There is no need for concern and it should clear up in a day or so.  SYMPTOMS TO REPORT IMMEDIATELY:  Following lower endoscopy (colonoscopy or flexible sigmoidoscopy):  Excessive amounts of blood in the stool  Significant tenderness or worsening of abdominal pains  Swelling of the abdomen that is new, acute  Fever of 100F or higher   For urgent or emergent issues, a gastroenterologist can be reached at any hour by calling [phone]. Do not use MyChart messaging for urgent concerns.    DIET:  We do recommend a small meal at first, but then you may proceed to your regular diet.  Drink plenty of fluids but you should avoid alcoholic beverages for 24 hours.  ACTIVITY:  You should plan to take it easy for  the rest of today and you should NOT DRIVE or use heavy machinery until tomorrow (because of the sedation medicines used during the test).    FOLLOW UP: Our staff will call the number listed on your records the next business day following your procedure.  We will call around 7:15- 8:00 am to check on you and address any questions or concerns that you may have regarding the information given to you following your procedure. If we do not reach you, we will leave a message.     If any biopsies were taken you will be contacted by phone or by letter within the next 1-3 weeks.  Please call us at [phone] if you have not heard about the biopsies in 3 weeks.    SIGNATURES/CONFIDENTIALITY: You and/or your care partner have signed paperwork which will be entered into your electronic medical record.  These signatures attest to the fact that that the information above on your After Visit Summary has been reviewed and is understood.  Full responsibility of the confidentiality of this discharge information lies with you and/or your care-partner. 

## 2021-10-12 NOTE — Op Note (Signed)
[facility] Patient Name: Christopher Mathis Procedure Date: 10/12/2021 9:07 AM MRN: [id number] Endoscopist: Nicki Reaper E. Candis Schatz , MD Age: 66 Referring MD:  Date of Birth: Sep 15, 1955 Gender: Male Account #: [id number] Procedure:                Colonoscopy Indications:              Screening for colorectal malignant neoplasm Medicines:                Monitored Anesthesia Care Procedure:                Pre-Anesthesia Assessment:                           - Prior to the procedure, a History and Physical                            was performed, and patient medications and                            allergies were reviewed. The patient's tolerance of                            previous anesthesia was also reviewed. The risks                            and benefits of the procedure and the sedation                            options and risks were discussed with the patient.                            All questions were answered, and informed consent                            was obtained. Prior Anticoagulants: The patient has                            taken no previous anticoagulant or antiplatelet                            agents. ASA Grade Assessment: II - A patient with                            mild systemic disease. After reviewing the risks                            and benefits, the patient was deemed in                            satisfactory condition to undergo the procedure.                           After obtaining informed consent, the colonoscope  was passed under direct vision. Throughout the                            procedure, the patient's blood pressure, pulse, and                            oxygen saturations were monitored continuously. The                            CF [id number] #[id number] was introduced through the anus                            and advanced to the the cecum, identified by                            appendiceal  orifice and ileocecal valve. The                            colonoscopy was performed with moderate difficulty                            due to a redundant colon, significant looping and a                            tortuous colon. Successful completion of the                            procedure was aided by using manual pressure and                            withdrawing and reinserting the scope. The patient                            tolerated the procedure well. The quality of the                            bowel preparation was adequate. The ileocecal                            valve, appendiceal orifice, and rectum were                            photographed. Scope In: 9:15:40 AM Scope Out: 9:44:53 AM Scope Withdrawal Time: 0 hours 18 minutes 5 seconds  Total Procedure Duration: 0 hours 29 minutes 13 seconds  Findings:                 The perianal and digital rectal examinations were                            normal. Pertinent negatives include normal                            sphincter tone and no palpable rectal lesions.  A few small-mouthed diverticula were found in the                            sigmoid colon and descending colon.                           The exam was otherwise normal throughout the                            examined colon.                           The retroflexed view of the distal rectum and anal                            verge was normal and showed no anal or rectal                            abnormalities. Complications:            No immediate complications. Estimated Blood Loss:     Estimated blood loss: none. Impression:               - Diverticulosis in the sigmoid colon and in the                            descending colon.                           - The distal rectum and anal verge are normal on                            retroflexion view.                           - No specimens collected. Recommendation:            - Patient has a contact number available for                            emergencies. The signs and symptoms of potential                            delayed complications were discussed with the                            patient. Return to normal activities tomorrow.                            Written discharge instructions were provided to the                            patient.                           - Resume previous diet.                           -  Continue present medications.                           - Repeat colonoscopy in 10 years for screening                            purposes. Sukaina Toothaker E. Candis Schatz, MD 10/12/2021 9:49:49 AM This report has been signed electronically.

## 2021-10-12 NOTE — Progress Notes (Signed)
Pt's states no medical or surgical changes since previsit or office visit. 

## 2021-10-12 NOTE — Progress Notes (Signed)
History and Physical Interval Note:  10/12/2021 9:06 AM  Christopher Mathis  has presented today for endoscopic procedure(s), with the diagnosis of  Encounter Diagnosis  Name Primary?   Special screening for malignant neoplasms, colon Yes  .  The various methods of evaluation and treatment have been discussed with the patient and/or family. After consideration of risks, benefits and other options for treatment, the patient has consented to  the endoscopic procedure(s).  Repeat attempt at screening colonoscopy from yesterday due to inadequate bowel prep.   The patient's history has been reviewed, patient examined, no change in status, stable for endoscopic procedure(s).  I have reviewed the patient's chart and labs.  Questions were answered to the patient's satisfaction.     Adelai Achey E. Candis Schatz, MD [facility]

## 2021-10-12 NOTE — Progress Notes (Signed)
PT taken to PACU. Monitors in place. VSS. Report given to RN. 

## 2021-10-15 ENCOUNTER — Telehealth: Payer: Self-pay | Admitting: *Deleted

## 2021-10-15 NOTE — Telephone Encounter (Signed)
  Follow up Call-    Row Labels 10/12/2021    8:40 AM 10/11/2021    8:14 AM  Call back number   Section Header. No data exists in this row.    Post procedure Call Back phone  #   [phone]  Permission to leave phone message   Yes Yes     Patient questions:  Do you have a fever, pain , or abdominal swelling? No. Pain Score  0 *  Have you tolerated food without any problems? Yes.    Have you been able to return to your normal activities? Yes.    Do you have any questions about your discharge instructions: Diet   No. Medications  No. Follow up visit  No.  Do you have questions or concerns about your Care? No.  Actions: * If pain score is 4 or above: No action needed, pain <4.

## 2021-10-18 ENCOUNTER — Encounter: Payer: Self-pay | Admitting: Podiatry

## 2021-10-18 ENCOUNTER — Ambulatory Visit (INDEPENDENT_AMBULATORY_CARE_PROVIDER_SITE_OTHER): Payer: BC Managed Care – PPO | Admitting: Podiatry

## 2021-10-18 DIAGNOSIS — L600 Ingrowing nail: Secondary | ICD-10-CM | POA: Diagnosis not present

## 2021-10-20 NOTE — Progress Notes (Signed)
Subjective: Chief Complaint  Patient presents with   Ingrown Toenail    Bilateral ingrown check, patient doing well    66 year old male with above concerns.  States he is doing well.  No swelling redness or drainage.  No pain.  Stopped soaking his toes.  Objective: AAO x3, NAD DP/PT pulses palpable bilaterally, CRT less than 3 seconds Status post partial nail avulsions which appear to be healing well.  There are some peeling skin present there is no drainage or pus or any fluctuation or crepitation but there is no malodor.  No pain. No pain with calf compression, swelling, warmth, erythema  Assessment: Status post partial nail avulsions, healing well  Plan: -All treatment options discussed with the patient including all alternatives, risks, complications.  -He is doing well.  Discussed nail trimming techniques to help with it reoccurrence.  Monitor for any recurrence or signs or symptoms of infection. -Patient encouraged to call the office with any questions, concerns, change in symptoms.   Trula Slade DPM

## 2021-10-31 DIAGNOSIS — Z23 Encounter for immunization: Secondary | ICD-10-CM | POA: Diagnosis not present

## 2022-01-09 ENCOUNTER — Encounter: Payer: Self-pay | Admitting: Neurology

## 2022-01-11 DIAGNOSIS — J018 Other acute sinusitis: Secondary | ICD-10-CM | POA: Diagnosis not present

## 2022-01-11 DIAGNOSIS — R051 Acute cough: Secondary | ICD-10-CM | POA: Diagnosis not present

## 2022-01-25 DIAGNOSIS — Z79899 Other long term (current) drug therapy: Secondary | ICD-10-CM | POA: Diagnosis not present

## 2022-01-25 DIAGNOSIS — I151 Hypertension secondary to other renal disorders: Secondary | ICD-10-CM | POA: Diagnosis not present

## 2022-01-25 DIAGNOSIS — Z940 Kidney transplant status: Secondary | ICD-10-CM | POA: Diagnosis not present

## 2022-01-25 DIAGNOSIS — T8610 Unspecified complication of kidney transplant: Secondary | ICD-10-CM | POA: Diagnosis not present

## 2022-01-25 DIAGNOSIS — R808 Other proteinuria: Secondary | ICD-10-CM | POA: Diagnosis not present

## 2022-01-25 DIAGNOSIS — N022 Recurrent and persistent hematuria with diffuse membranous glomerulonephritis: Secondary | ICD-10-CM | POA: Diagnosis not present

## 2022-01-31 DIAGNOSIS — D044 Carcinoma in situ of skin of scalp and neck: Secondary | ICD-10-CM | POA: Diagnosis not present

## 2022-06-11 ENCOUNTER — Other Ambulatory Visit: Payer: Self-pay | Admitting: Neurology

## 2022-06-11 DIAGNOSIS — G250 Essential tremor: Secondary | ICD-10-CM

## 2022-06-21 DIAGNOSIS — E785 Hyperlipidemia, unspecified: Secondary | ICD-10-CM | POA: Diagnosis not present

## 2022-06-21 DIAGNOSIS — Z125 Encounter for screening for malignant neoplasm of prostate: Secondary | ICD-10-CM | POA: Diagnosis not present

## 2022-06-21 DIAGNOSIS — I10 Essential (primary) hypertension: Secondary | ICD-10-CM | POA: Diagnosis not present

## 2022-06-24 ENCOUNTER — Ambulatory Visit: Payer: BC Managed Care – PPO | Admitting: Neurology

## 2022-06-27 DIAGNOSIS — L814 Other melanin hyperpigmentation: Secondary | ICD-10-CM | POA: Diagnosis not present

## 2022-06-27 DIAGNOSIS — Z85828 Personal history of other malignant neoplasm of skin: Secondary | ICD-10-CM | POA: Diagnosis not present

## 2022-06-27 DIAGNOSIS — D229 Melanocytic nevi, unspecified: Secondary | ICD-10-CM | POA: Diagnosis not present

## 2022-06-27 DIAGNOSIS — L821 Other seborrheic keratosis: Secondary | ICD-10-CM | POA: Diagnosis not present

## 2022-07-01 ENCOUNTER — Ambulatory Visit: Payer: BC Managed Care – PPO | Admitting: Neurology

## 2022-07-05 NOTE — Progress Notes (Unsigned)
    Assessment/Plan:    1.  Essential Tremor  -Patient currently on propranolol, 20 mg twice per day and will continue that  2.  History of renal transplant, 2002  -Patient follows with [facility] still.  3.  Since patient is doing well, I refilled his prescription for 1 year.  After that, we will let primary care take over the prescription.  Subjective:   Christopher Mathis was seen today in follow up for essential tremor.  My previous records were reviewed prior to todays visit.  Patient currently on propranolol, 20 mg twice per day.  He is tolerating that well, without side effects.  Current prescribed movement disorder medications: Propranolol, 20 mg twice daily    ALLERGIES:  No Known Allergies  CURRENT MEDICATIONS:  No outpatient medications have been marked as taking for the 07/09/22 encounter (Appointment) with Mazal Ebey, Octaviano Batty, DO.      Objective:    PHYSICAL EXAMINATION:    VITALS:   There were no vitals filed for this visit.   GEN:  The patient appears stated age and is in NAD. HEENT:  Normocephalic, atraumatic.  The mucous membranes are moist. The superficial temporal arteries are without ropiness or tenderness. CV:  rrr Lungs:  ctab Neck/HEME: no bruits  Neurological examination:  Orientation: The patient is alert and oriented x3. Cranial nerves: There is good facial symmetry. The speech is fluent and clear. Soft palate rises symmetrically and there is no tongue deviation. Hearing is intact to conversational tone. Sensation: Sensation is intact to light touch throughout Motor: Strength is at least antigravity x4.  Movement examination: Tone: There is normal tone in the bilateral upper extremities.  The tone in the lower extremities is normal.  Abnormal movements: No rest tremor.  There is no postural or intention tremor today.  Archimedes spirals done well today.  Gait and Station: ambulates well in hall  Total time spent on today's visit was ***  minutes, including both face-to-face time and nonface-to-face time.  Time included that spent on review of records (prior notes available to me/labs/imaging if pertinent), discussing treatment and goals, answering patient's questions and coordinating care.   Cc:  Adrian Prince, MD

## 2022-07-08 DIAGNOSIS — Z0000 Encounter for general adult medical examination without abnormal findings: Secondary | ICD-10-CM | POA: Diagnosis not present

## 2022-07-08 DIAGNOSIS — R82998 Other abnormal findings in urine: Secondary | ICD-10-CM | POA: Diagnosis not present

## 2022-07-08 DIAGNOSIS — I10 Essential (primary) hypertension: Secondary | ICD-10-CM | POA: Diagnosis not present

## 2022-07-08 DIAGNOSIS — Z1331 Encounter for screening for depression: Secondary | ICD-10-CM | POA: Diagnosis not present

## 2022-07-08 DIAGNOSIS — Z1339 Encounter for screening examination for other mental health and behavioral disorders: Secondary | ICD-10-CM | POA: Diagnosis not present

## 2022-07-09 ENCOUNTER — Ambulatory Visit (INDEPENDENT_AMBULATORY_CARE_PROVIDER_SITE_OTHER): Payer: BC Managed Care – PPO | Admitting: Neurology

## 2022-07-09 ENCOUNTER — Ambulatory Visit: Payer: BC Managed Care – PPO | Admitting: Neurology

## 2022-07-09 ENCOUNTER — Encounter: Payer: Self-pay | Admitting: Neurology

## 2022-07-09 DIAGNOSIS — G250 Essential tremor: Secondary | ICD-10-CM | POA: Diagnosis not present

## 2022-07-09 MED ORDER — PROPRANOLOL HCL 20 MG PO TABS: 20.0000 mg | ORAL_TABLET | Freq: Two times a day (BID) | ORAL | 3 refills | Status: DC

## 2022-07-10 ENCOUNTER — Ambulatory Visit: Payer: BC Managed Care – PPO | Admitting: Neurology

## 2022-07-25 DIAGNOSIS — N022 Recurrent and persistent hematuria with diffuse membranous glomerulonephritis: Secondary | ICD-10-CM | POA: Diagnosis not present

## 2022-07-25 DIAGNOSIS — R808 Other proteinuria: Secondary | ICD-10-CM | POA: Diagnosis not present

## 2022-07-25 DIAGNOSIS — Z940 Kidney transplant status: Secondary | ICD-10-CM | POA: Diagnosis not present

## 2022-07-25 DIAGNOSIS — D849 Immunodeficiency, unspecified: Secondary | ICD-10-CM | POA: Diagnosis not present

## 2022-07-25 DIAGNOSIS — I151 Hypertension secondary to other renal disorders: Secondary | ICD-10-CM | POA: Diagnosis not present

## 2022-07-25 DIAGNOSIS — L570 Actinic keratosis: Secondary | ICD-10-CM | POA: Diagnosis not present

## 2022-07-25 DIAGNOSIS — Z85828 Personal history of other malignant neoplasm of skin: Secondary | ICD-10-CM | POA: Diagnosis not present

## 2022-07-25 DIAGNOSIS — Z130 Encounter for screening for diseases of the blood and blood-forming organs and certain disorders involving the immune mechanism: Secondary | ICD-10-CM | POA: Diagnosis not present

## 2022-07-25 DIAGNOSIS — Z4822 Encounter for aftercare following kidney transplant: Secondary | ICD-10-CM | POA: Diagnosis not present

## 2022-07-25 DIAGNOSIS — Z79899 Other long term (current) drug therapy: Secondary | ICD-10-CM | POA: Diagnosis not present

## 2022-09-17 ENCOUNTER — Other Ambulatory Visit: Payer: Self-pay | Admitting: Neurology

## 2022-09-17 DIAGNOSIS — G250 Essential tremor: Secondary | ICD-10-CM

## 2022-10-29 DIAGNOSIS — Z23 Encounter for immunization: Secondary | ICD-10-CM | POA: Diagnosis not present

## 2022-11-08 DIAGNOSIS — Z940 Kidney transplant status: Secondary | ICD-10-CM | POA: Diagnosis not present

## 2022-11-08 DIAGNOSIS — Z79899 Other long term (current) drug therapy: Secondary | ICD-10-CM | POA: Diagnosis not present

## 2022-11-08 DIAGNOSIS — I151 Hypertension secondary to other renal disorders: Secondary | ICD-10-CM | POA: Diagnosis not present

## 2022-12-16 ENCOUNTER — Other Ambulatory Visit: Payer: Self-pay | Admitting: Neurology

## 2022-12-16 DIAGNOSIS — G250 Essential tremor: Secondary | ICD-10-CM

## 2023-01-02 DIAGNOSIS — R051 Acute cough: Secondary | ICD-10-CM | POA: Diagnosis not present

## 2023-01-02 DIAGNOSIS — J069 Acute upper respiratory infection, unspecified: Secondary | ICD-10-CM | POA: Diagnosis not present

## 2023-01-02 DIAGNOSIS — J029 Acute pharyngitis, unspecified: Secondary | ICD-10-CM | POA: Diagnosis not present

## 2023-01-10 DIAGNOSIS — Z79899 Other long term (current) drug therapy: Secondary | ICD-10-CM | POA: Diagnosis not present

## 2023-01-10 DIAGNOSIS — Z940 Kidney transplant status: Secondary | ICD-10-CM | POA: Diagnosis not present

## 2023-01-10 DIAGNOSIS — I151 Hypertension secondary to other renal disorders: Secondary | ICD-10-CM | POA: Diagnosis not present

## 2023-04-03 DIAGNOSIS — I151 Hypertension secondary to other renal disorders: Secondary | ICD-10-CM | POA: Diagnosis not present

## 2023-04-03 DIAGNOSIS — Z940 Kidney transplant status: Secondary | ICD-10-CM | POA: Diagnosis not present

## 2023-04-03 DIAGNOSIS — Z79899 Other long term (current) drug therapy: Secondary | ICD-10-CM | POA: Diagnosis not present

## 2023-04-29 DIAGNOSIS — I151 Hypertension secondary to other renal disorders: Secondary | ICD-10-CM | POA: Diagnosis not present

## 2023-04-29 DIAGNOSIS — Z940 Kidney transplant status: Secondary | ICD-10-CM | POA: Diagnosis not present

## 2023-04-29 DIAGNOSIS — Z7960 Long term (current) use of unspecified immunomodulators and immunosuppressants: Secondary | ICD-10-CM | POA: Diagnosis not present

## 2023-05-08 DIAGNOSIS — Z7960 Long term (current) use of unspecified immunomodulators and immunosuppressants: Secondary | ICD-10-CM | POA: Diagnosis not present

## 2023-05-08 DIAGNOSIS — Z940 Kidney transplant status: Secondary | ICD-10-CM | POA: Diagnosis not present

## 2023-06-27 DIAGNOSIS — Z125 Encounter for screening for malignant neoplasm of prostate: Secondary | ICD-10-CM | POA: Diagnosis not present

## 2023-06-27 DIAGNOSIS — E785 Hyperlipidemia, unspecified: Secondary | ICD-10-CM | POA: Diagnosis not present

## 2023-07-07 NOTE — Progress Notes (Unsigned)
    Assessment/Plan:    1.  Essential Tremor  -Patient currently on propranolol , 20 mg twice per day and will continue that  2.  History of renal transplant, 2002  -Patient follows with [facility] still.  3.  F/u 1 year.  Subjective:   Christopher Mathis was seen today in follow up for essential tremor.  My previous records were reviewed prior to todays visit.  Patient currently on propranolol , 20 mg twice per day.  He is tolerating that well, without side effects.  Tremor is certainly no worse.  He is working and still enjoying the job.  No lightheadedness or dizziness.    Current prescribed movement disorder medications: Propranolol , 20 mg twice daily    ALLERGIES:  No Known Allergies  CURRENT MEDICATIONS:  Current Meds  Medication Sig   lisinopril (ZESTRIL) 40 MG tablet Take 40 mg by mouth daily.   losartan (COZAAR) 100 MG tablet Take 100 mg by mouth daily.   mycophenolate (CELLCEPT) 500 MG tablet Take 1,000 mg by mouth 2 (two) times daily.    propranolol  (INDERAL ) 20 MG tablet TAKE 1 TABLET BY MOUTH TWICE DAILY IN THE MORNING AND IN THE LATE AFTERNOON   tacrolimus  (PROGRAF ) 0.5 MG capsule Take 0.5 mg by mouth 2 (two) times daily.   tacrolimus  (PROGRAF ) 1 MG capsule Take 1.5 mg by mouth 2 (two) times daily.     Objective:    PHYSICAL EXAMINATION:    VITALS:   Vitals:   07/09/23 0818  BP: 118/72  Pulse: 68  SpO2: 96%  Weight: 185 lb (83.9 kg)  Height: 5' 10 (1.778 m)    GEN:  The patient appears stated age and is in NAD. HEENT:  Normocephalic, atraumatic.  The mucous membranes are moist. The superficial temporal arteries are without ropiness or tenderness. CV:  RRR Lungs:  CTAB Neck/HEME: no bruits  Neurological examination:  Orientation: The patient is alert and oriented x3. Cranial nerves: There is good facial symmetry. The speech is fluent and clear. Soft palate rises symmetrically and there is no tongue deviation. Hearing is intact to conversational  tone. Sensation: Sensation is intact to light touch throughout Motor: Strength is at least antigravity x4.  Movement examination: Tone: There is normal tone in the bilateral upper extremities.  The tone in the lower extremities is normal.  Abnormal movements: No rest tremor.  There is no postural and mild intention tremor bilaterally.     Cc:  Nichole Senior, MD

## 2023-07-09 ENCOUNTER — Ambulatory Visit (INDEPENDENT_AMBULATORY_CARE_PROVIDER_SITE_OTHER): Payer: BC Managed Care – PPO | Admitting: Neurology

## 2023-07-09 ENCOUNTER — Encounter: Payer: Self-pay | Admitting: Neurology

## 2023-07-09 VITALS — BP 118/72 | HR 68 | Ht 70.0 in | Wt 185.0 lb

## 2023-07-09 DIAGNOSIS — G250 Essential tremor: Secondary | ICD-10-CM | POA: Diagnosis not present

## 2023-07-09 NOTE — Patient Instructions (Signed)

## 2023-07-10 DIAGNOSIS — L570 Actinic keratosis: Secondary | ICD-10-CM | POA: Diagnosis not present

## 2023-07-10 DIAGNOSIS — L814 Other melanin hyperpigmentation: Secondary | ICD-10-CM | POA: Diagnosis not present

## 2023-07-10 DIAGNOSIS — L821 Other seborrheic keratosis: Secondary | ICD-10-CM | POA: Diagnosis not present

## 2023-07-10 DIAGNOSIS — D229 Melanocytic nevi, unspecified: Secondary | ICD-10-CM | POA: Diagnosis not present

## 2023-07-11 DIAGNOSIS — Z0000 Encounter for general adult medical examination without abnormal findings: Secondary | ICD-10-CM | POA: Diagnosis not present

## 2023-07-11 DIAGNOSIS — Z1339 Encounter for screening examination for other mental health and behavioral disorders: Secondary | ICD-10-CM | POA: Diagnosis not present

## 2023-07-11 DIAGNOSIS — R82998 Other abnormal findings in urine: Secondary | ICD-10-CM | POA: Diagnosis not present

## 2023-07-11 DIAGNOSIS — Z1331 Encounter for screening for depression: Secondary | ICD-10-CM | POA: Diagnosis not present

## 2023-07-11 DIAGNOSIS — I10 Essential (primary) hypertension: Secondary | ICD-10-CM | POA: Diagnosis not present

## 2023-09-02 DIAGNOSIS — Z7960 Long term (current) use of unspecified immunomodulators and immunosuppressants: Secondary | ICD-10-CM | POA: Diagnosis not present

## 2023-09-02 DIAGNOSIS — Z940 Kidney transplant status: Secondary | ICD-10-CM | POA: Diagnosis not present

## 2023-09-21 ENCOUNTER — Other Ambulatory Visit: Payer: Self-pay | Admitting: Neurology

## 2023-09-21 DIAGNOSIS — G250 Essential tremor: Secondary | ICD-10-CM

## 2023-10-14 DIAGNOSIS — Z23 Encounter for immunization: Secondary | ICD-10-CM | POA: Diagnosis not present

## 2023-10-24 DIAGNOSIS — I10 Essential (primary) hypertension: Secondary | ICD-10-CM | POA: Diagnosis not present

## 2023-10-24 DIAGNOSIS — Z940 Kidney transplant status: Secondary | ICD-10-CM | POA: Diagnosis not present

## 2023-10-24 DIAGNOSIS — N048 Nephrotic syndrome with other morphologic changes: Secondary | ICD-10-CM | POA: Diagnosis not present

## 2023-10-24 DIAGNOSIS — Z23 Encounter for immunization: Secondary | ICD-10-CM | POA: Diagnosis not present

## 2023-10-24 DIAGNOSIS — Z79899 Other long term (current) drug therapy: Secondary | ICD-10-CM | POA: Diagnosis not present

## 2023-10-24 DIAGNOSIS — Z85828 Personal history of other malignant neoplasm of skin: Secondary | ICD-10-CM | POA: Diagnosis not present

## 2023-10-24 DIAGNOSIS — L570 Actinic keratosis: Secondary | ICD-10-CM | POA: Diagnosis not present

## 2023-10-24 DIAGNOSIS — Z4822 Encounter for aftercare following kidney transplant: Secondary | ICD-10-CM | POA: Diagnosis not present

## 2023-10-24 DIAGNOSIS — Z7960 Long term (current) use of unspecified immunomodulators and immunosuppressants: Secondary | ICD-10-CM | POA: Diagnosis not present

## 2024-02-05 ENCOUNTER — Encounter

## 2024-02-05 ENCOUNTER — Ambulatory Visit

## 2024-02-05 ENCOUNTER — Ambulatory Visit: Admitting: Podiatry

## 2024-02-05 DIAGNOSIS — L600 Ingrowing nail: Secondary | ICD-10-CM

## 2024-02-05 DIAGNOSIS — M7751 Other enthesopathy of right foot: Secondary | ICD-10-CM

## 2024-02-05 NOTE — Patient Instructions (Signed)

## 2024-02-07 NOTE — Progress Notes (Signed)
 Subjective:   Patient ID: Christopher Mathis, male   DOB: 69 y.o.   MRN: [id number]   HPI Chief Complaint  Patient presents with   Ingrown Toenail    Patient presents to office today for possible ingrown toenail on his Right big toe   69 year old male presents the office today with above concerns.  He states that the left foot has been doing well but the right big toes become ingrown again causing discomfort particular with pressure.  He does not report any drainage or pus.  No injuries.   Review of Systems  All other systems reviewed and are negative.       Objective:  Physical Exam  General: AAO x3, NAD  Dermatological: Incurvation present on the both medial, lateral aspect of the right hallux toenail with localized edema but there is no drainage or pus or ascending cellulitis.  There is no open lesions.  The nail is quite curved and is starting to become almost a pincher nail type deformity.  Vascular: Dorsalis Pedis artery and Posterior Tibial artery pedal pulses are 2/4 bilateral with immedate capillary fill time. There is no pain with calf compression, swelling, warmth, erythema.   Neruologic: Grossly intact via light touch bilateral.   Musculoskeletal: The toenail.  No other areas discomfort.       Assessment:   Ingrown toenail right hallux, bone spur     Plan:  -Treatment options discussed including all alternatives, risks, and complications -Etiology of symptoms were discussed  Recurrent ingrown toenail right hallux -At this time, the patient is requesting partial nail removal with chemical matricectomy to the symptomatic portion of the nail. Risks and complications were discussed with the patient for which they understand and written consent was obtained. Under sterile conditions a total of 3 mL of a mixture of 2% lidocaine plain and 0.5% Marcaine plain was infiltrated in a hallux block fashion. Once anesthetized, the skin was prepped in sterile fashion. A tourniquet  was then applied. Next the medial, lateral aspect of hallux nail border was then sharply excised making sure to remove the entire offending nail border. Once the nails were ensured to be removed area was debrided and the underlying skin was intact. There is no purulence identified in the procedure. Next phenol was then applied under standard conditions and copiously irrigated.  Silvadene was applied. A dry sterile dressing was applied. After application of the dressing the tourniquet was removed and there is found to be an immediate capillary refill time to the digit. The patient tolerated the procedure well any complications. Post procedure instructions were discussed the patient for which he verbally understood. Discussed signs/symptoms of infection and directed to call the office immediately should any occur or go directly to the emergency room. In the meantime, encouraged to call the office with any questions, concerns, changes symptoms. - We did discuss total nail avulsion as well as other treatment options.  We mutually agreed upon the above procedure.    Bone spur right hallux -X-rays were obtained and reviewed to help identify causes of the ingrown toenail.  There is dorsal prominence noted on the distal phalanx.  We discussed offloading and long-term may need to consider surgical intervention.  Return in about 2 weeks (around 02/19/2024), or if symptoms worsen or fail to improve, for nail cehck .  Donnice JONELLE Fees DPM

## 2024-02-12 ENCOUNTER — Other Ambulatory Visit: Payer: Self-pay | Admitting: Podiatry

## 2024-02-12 ENCOUNTER — Encounter: Payer: Self-pay | Admitting: Podiatry

## 2024-02-12 MED ORDER — DOXYCYCLINE HYCLATE 100 MG PO TABS: 100.0000 mg | ORAL_TABLET | Freq: Two times a day (BID) | ORAL | 0 refills | Status: AC

## 2024-02-13 ENCOUNTER — Ambulatory Visit: Admitting: Podiatry

## 2024-02-13 DIAGNOSIS — T148XXA Other injury of unspecified body region, initial encounter: Secondary | ICD-10-CM

## 2024-02-13 DIAGNOSIS — L089 Local infection of the skin and subcutaneous tissue, unspecified: Secondary | ICD-10-CM

## 2024-02-13 NOTE — Patient Instructions (Signed)

## 2024-02-13 NOTE — Progress Notes (Signed)
 Subjective:   Patient ID: Christopher Mathis, male   DOB: 69 y.o.   MRN: [id number]   HPI Chief Complaint  Patient presents with   Nail Problem    RT 1st. Reports improvement in the area at the base of the rt 1st today.     69 year old male presents the office today with above concerns.  He states that since yesterday the toe started looking better.  Originally after the procedure he was using a petroleum jelly type ointment but he has recently switched to Neosporin.  He still been soaking in Epsom salts.  No fevers or chills.    Review of Systems  All other systems reviewed and are negative.       Objective:  Physical Exam  General: AAO x3, NAD-presents wearing surgical shoe  Dermatological: As pictured below in the proximal nail fold is an area of a blister.  I did drain this today and clear fluid was expressed.  There is no frank purulence.  Granulation tissue present in the nail borders.  Localized edema and erythema on the nail but there is no ascending cellulitis.  No fluctuation or crepitation.     Vascular: Dorsalis Pedis artery and Posterior Tibial artery pedal pulses are 2/4 bilateral with immedate capillary fill time. There is no pain with calf compression, swelling, warmth, erythema.  Ensured adequate circulation today.  Neruologic: Grossly intact via light touch bilateral.   Musculoskeletal: Tenderness on the procedure site.  No other areas discomfort.       Assessment:   Status post partial nail avulsion with localized infection, blister     Plan:  -Treatment options discussed including all alternatives, risks, and complications -Etiology of symptoms were discussed - I did clean the area the blister with alcohol .  I did puncture this with a 25-gauge needle.  Clear fluid was expressed.  This was cultured today.  Recommended continue with doxycycline  as well as Epsom salt soaks cover with antibiotic ointment and a bandage daily.  Monitor any signs or symptoms of  worsening infection let me know should any occur.  Manage surgical shoe for offloading until symptoms improve, resolved.  No follow-ups on file.  Donnice JONELLE Fees DPM

## 2024-02-17 ENCOUNTER — Ambulatory Visit: Payer: Self-pay | Admitting: Podiatry

## 2024-02-17 LAB — AEROBIC CULTURE
AER RESULT:: NO GROWTH
MICRO NUMBER:: 17533704
SPECIMEN QUALITY:: ADEQUATE

## 2024-02-19 ENCOUNTER — Ambulatory Visit: Admitting: Podiatry

## 2024-02-19 ENCOUNTER — Encounter (INDEPENDENT_AMBULATORY_CARE_PROVIDER_SITE_OTHER): Payer: Self-pay

## 2024-07-08 ENCOUNTER — Ambulatory Visit: Admitting: Neurology
